# Patient Record
Sex: Male | Born: 1950
Health system: Southern US, Community
[De-identification: ages and names within clinical notes are randomized; demographics above are authoritative.]

## PROBLEM LIST (undated history)

## (undated) DIAGNOSIS — E785 Hyperlipidemia, unspecified: Secondary | ICD-10-CM

## (undated) DIAGNOSIS — L290 Pruritus ani: Secondary | ICD-10-CM

## (undated) DIAGNOSIS — J329 Chronic sinusitis, unspecified: Secondary | ICD-10-CM

## (undated) DIAGNOSIS — T7840XA Allergy, unspecified, initial encounter: Secondary | ICD-10-CM

## (undated) HISTORY — DX: Pruritus ani: L29.0

## (undated) HISTORY — DX: Chronic sinusitis, unspecified: J32.9

## (undated) HISTORY — DX: Hyperlipidemia, unspecified: E78.5

## (undated) HISTORY — DX: Allergy, unspecified, initial encounter: T78.40XA

---

## 2002-02-14 HISTORY — PX: KNEE ARTHROSCOPY: SUR90

## 2002-03-04 ENCOUNTER — Ambulatory Visit (HOSPITAL_COMMUNITY): Admission: RE | Admit: 2002-03-04 | Discharge: 2002-03-04 | Payer: Self-pay | Admitting: Orthopaedic Surgery

## 2002-03-04 ENCOUNTER — Encounter: Payer: Self-pay | Admitting: Orthopaedic Surgery

## 2004-02-15 HISTORY — PX: CARPAL TUNNEL RELEASE: SHX101

## 2008-08-12 ENCOUNTER — Encounter (INDEPENDENT_AMBULATORY_CARE_PROVIDER_SITE_OTHER): Payer: Self-pay | Admitting: *Deleted

## 2011-07-05 ENCOUNTER — Encounter: Payer: Self-pay | Admitting: Gastroenterology

## 2011-10-03 ENCOUNTER — Encounter: Payer: Self-pay | Admitting: Gastroenterology

## 2011-11-28 ENCOUNTER — Encounter: Payer: Self-pay | Admitting: Gastroenterology

## 2011-11-28 ENCOUNTER — Ambulatory Visit (AMBULATORY_SURGERY_CENTER): Payer: Managed Care, Other (non HMO) | Admitting: *Deleted

## 2011-11-28 VITALS — Ht 73.0 in | Wt 203.0 lb

## 2011-11-28 DIAGNOSIS — Z1211 Encounter for screening for malignant neoplasm of colon: Secondary | ICD-10-CM

## 2011-11-28 MED ORDER — MOVIPREP 100 G PO SOLR: ORAL | Status: DC

## 2011-12-09 ENCOUNTER — Ambulatory Visit (AMBULATORY_SURGERY_CENTER): Payer: Managed Care, Other (non HMO) | Admitting: Gastroenterology

## 2011-12-09 ENCOUNTER — Encounter: Payer: Self-pay | Admitting: Gastroenterology

## 2011-12-09 VITALS — BP 121/76 | HR 68 | Temp 96.6°F | Resp 17 | Ht 73.0 in | Wt 203.0 lb

## 2011-12-09 DIAGNOSIS — D126 Benign neoplasm of colon, unspecified: Secondary | ICD-10-CM

## 2011-12-09 DIAGNOSIS — Z1211 Encounter for screening for malignant neoplasm of colon: Secondary | ICD-10-CM

## 2011-12-09 MED ORDER — SODIUM CHLORIDE 0.9 % IV SOLN: 500.0000 mL | INTRAVENOUS | Status: DC

## 2011-12-09 NOTE — Op Note (Signed)
[facility] [address] [place], [address]   COLONOSCOPY PROCEDURE REPORT  PATIENT: Greg Thomas, Greg Thomas  MR#: [id number] BIRTHDATE: September 08, 1950 , 61  yrs. old GENDER: Male ENDOSCOPIST: Meryl Dare, MD, [facility] PROCEDURE DATE:  12/09/2011 PROCEDURE:   Colonoscopy with snare polypectomy ASA CLASS:   Class II INDICATIONS:average risk screening. MEDICATIONS: MAC sedation, administered by CRNA and propofol (Diprivan) 240mg  IV DESCRIPTION OF PROCEDURE:   After the risks benefits and alternatives of the procedure were thoroughly explained, informed consent was obtained.  A digital rectal exam revealed no abnormalities of the rectum.   The LB CF-[id number]  endoscope was introduced through the anus and advanced to the cecum, which was identified by both the appendix and ileocecal valve. No adverse events experienced.   The quality of the prep was good, using MoviPrep  The instrument was then slowly withdrawn as the colon was fully examined.  COLON FINDINGS: A sessile polyp measuring 8 mm in size was found at the cecum.  A polypectomy was performed using snare cautery.  The resection was complete and the polyp tissue was completely retrieved.   Mild diverticulosis was noted in the sigmoid colon. Retroflexed views revealed small internal hemorrhoids. The time to cecum=1 minutes 15 seconds.  Withdrawal time=10 minutes 45 seconds. The scope was withdrawn and the procedure completed.  COMPLICATIONS: There were no complications.  ENDOSCOPIC IMPRESSION: 1.  Sessile polyp measuring 8 mm at the cecum; polypectomy was performed using snare cautery 2.  Small internal hemorrhoids  RECOMMENDATIONS: 1.  Await pathology results 2.  Repeat colonoscopy in 5 years if polyp adenomatous; otherwise 10 years 3.  High fiber diet with liberal fluid intake.  eSigned:  Meryl Dare, MD, [facility] 12/09/2011 9:40 AM   cc: Rodrigo Ran, MD

## 2011-12-09 NOTE — Patient Instructions (Addendum)

## 2011-12-09 NOTE — Progress Notes (Signed)
Patient did not experience any of the following events: a burn prior to discharge; a fall within the facility; wrong site/side/patient/procedure/implant event; or a hospital transfer or hospital admission upon discharge from the facility. [phone]) Patient did not have preoperative order for IV antibiotic SSI prophylaxis.8)

## 2011-12-12 ENCOUNTER — Telehealth: Payer: Self-pay | Admitting: *Deleted

## 2011-12-12 NOTE — Telephone Encounter (Signed)
Left message on number given in admitting on Friday. ewm 

## 2011-12-13 ENCOUNTER — Encounter: Payer: Self-pay | Admitting: Gastroenterology

## 2014-10-23 ENCOUNTER — Encounter: Payer: Self-pay | Admitting: Gastroenterology

## 2016-08-05 DIAGNOSIS — J301 Allergic rhinitis due to pollen: Secondary | ICD-10-CM | POA: Diagnosis not present

## 2016-08-05 DIAGNOSIS — J342 Deviated nasal septum: Secondary | ICD-10-CM | POA: Diagnosis not present

## 2016-08-05 DIAGNOSIS — J329 Chronic sinusitis, unspecified: Secondary | ICD-10-CM | POA: Diagnosis not present

## 2016-08-22 DIAGNOSIS — J329 Chronic sinusitis, unspecified: Secondary | ICD-10-CM | POA: Diagnosis not present

## 2016-08-29 DIAGNOSIS — J301 Allergic rhinitis due to pollen: Secondary | ICD-10-CM | POA: Diagnosis not present

## 2016-08-29 DIAGNOSIS — J329 Chronic sinusitis, unspecified: Secondary | ICD-10-CM | POA: Diagnosis not present

## 2016-08-29 DIAGNOSIS — J342 Deviated nasal septum: Secondary | ICD-10-CM | POA: Diagnosis not present

## 2016-10-12 DIAGNOSIS — E784 Other hyperlipidemia: Secondary | ICD-10-CM | POA: Diagnosis not present

## 2016-10-12 DIAGNOSIS — Z125 Encounter for screening for malignant neoplasm of prostate: Secondary | ICD-10-CM | POA: Diagnosis not present

## 2016-10-12 DIAGNOSIS — R7301 Impaired fasting glucose: Secondary | ICD-10-CM | POA: Diagnosis not present

## 2016-10-12 DIAGNOSIS — R946 Abnormal results of thyroid function studies: Secondary | ICD-10-CM | POA: Diagnosis not present

## 2016-10-12 DIAGNOSIS — M109 Gout, unspecified: Secondary | ICD-10-CM | POA: Diagnosis not present

## 2016-10-26 DIAGNOSIS — Z6827 Body mass index (BMI) 27.0-27.9, adult: Secondary | ICD-10-CM | POA: Diagnosis not present

## 2016-10-26 DIAGNOSIS — Z23 Encounter for immunization: Secondary | ICD-10-CM | POA: Diagnosis not present

## 2016-10-26 DIAGNOSIS — R0683 Snoring: Secondary | ICD-10-CM | POA: Diagnosis not present

## 2016-10-26 DIAGNOSIS — J3089 Other allergic rhinitis: Secondary | ICD-10-CM | POA: Diagnosis not present

## 2016-10-26 DIAGNOSIS — M109 Gout, unspecified: Secondary | ICD-10-CM | POA: Diagnosis not present

## 2016-10-26 DIAGNOSIS — R7301 Impaired fasting glucose: Secondary | ICD-10-CM | POA: Diagnosis not present

## 2016-10-26 DIAGNOSIS — E784 Other hyperlipidemia: Secondary | ICD-10-CM | POA: Diagnosis not present

## 2016-10-26 DIAGNOSIS — Z0000 Encounter for general adult medical examination without abnormal findings: Secondary | ICD-10-CM | POA: Diagnosis not present

## 2016-10-26 DIAGNOSIS — L308 Other specified dermatitis: Secondary | ICD-10-CM | POA: Diagnosis not present

## 2016-10-26 DIAGNOSIS — F5221 Male erectile disorder: Secondary | ICD-10-CM | POA: Diagnosis not present

## 2016-10-26 DIAGNOSIS — Z1389 Encounter for screening for other disorder: Secondary | ICD-10-CM | POA: Diagnosis not present

## 2016-10-26 DIAGNOSIS — K4090 Unilateral inguinal hernia, without obstruction or gangrene, not specified as recurrent: Secondary | ICD-10-CM | POA: Diagnosis not present

## 2016-10-28 DIAGNOSIS — Z1212 Encounter for screening for malignant neoplasm of rectum: Secondary | ICD-10-CM | POA: Diagnosis not present

## 2016-11-29 DIAGNOSIS — J342 Deviated nasal septum: Secondary | ICD-10-CM | POA: Diagnosis not present

## 2016-11-29 DIAGNOSIS — J329 Chronic sinusitis, unspecified: Secondary | ICD-10-CM | POA: Diagnosis not present

## 2016-12-06 ENCOUNTER — Encounter: Payer: Self-pay | Admitting: *Deleted

## 2016-12-09 NOTE — Discharge Instructions (Signed)
[facility] °[facility] °ENDOSCOPIC SINUS SURGERY °[facility] ° °What is Functional Endoscopic Sinus Surgery? ° The Surgery involves making the natural openings of the sinuses larger by removing the bony partitions that separate the sinuses from the nasal cavity.  The natural sinus lining is preserved as much as possible to allow the sinuses to resume normal function after the surgery.  In some patients nasal polyps (excessively swollen lining of the sinuses) may be removed to relieve obstruction of the sinus openings.  The surgery is performed through the nose using lighted scopes, which eliminates the need for incisions on the face.  A septoplasty is a different procedure which is sometimes performed with sinus surgery.  It involves straightening the boy partition that separates the two sides of your nose.  A crooked or deviated septum may need repair if is obstructing the sinuses or nasal airflow.  Turbinate reduction is also often performed during sinus surgery.  The turbinates are bony proturberances from the side walls of the nose which swell and can obstruct the nose in patients with sinus and allergy problems.  Their size can be surgically reduced to help relieve nasal obstruction. ° °What Can Sinus Surgery Do For Me? ° Sinus surgery can reduce the frequency of sinus infections requiring antibiotic treatment.  This can provide improvement in nasal congestion, post-nasal drainage, facial pressure and nasal obstruction.  Surgery will NOT prevent you from ever having an infection again, so it usually only for patients who get infections 4 or more times yearly requiring antibiotics, or for infections that do not clear with antibiotics.  It will not cure nasal allergies, so patients with allergies may still require medication to treat their allergies after surgery. Surgery may improve headaches related to sinusitis, however, some people will continue to  require medication to control sinus headaches related to allergies.  Surgery will do nothing for other forms of headache (migraine, tension or cluster). ° °What Are the Risks of Endoscopic Sinus Surgery? ° Current techniques allow surgery to be performed safely with little risk, however, there are rare complications that patients should be aware of.  Because the sinuses are located around the eyes, there is risk of eye injury, including blindness, though again, this would be quite rare. This is usually a result of bleeding behind the eye during surgery, which puts the vision oat risk, though there are treatments to protect the vision and prevent permanent disrupted by surgery causing a leak of the spinal fluid that surrounds the brain.  More serious complications would include bleeding inside the brain cavity or damage to the brain.  Again, all of these complications are uncommon, and spinal fluid leaks can be safely managed surgically if they occur.  The most common complication of sinus surgery is bleeding from the nose, which may require packing or cauterization of the nose.  Continued sinus have polyps may experience recurrence of the polyps requiring revision surgery.  Alterations of sense of smell or injury to the tear ducts are also rare complications.  ° °What is the Surgery Like, and what is the Recovery? ° The Surgery usually takes a couple of hours to perform, and is usually performed under a general anesthetic (completely asleep).  Patients are usually discharged home after a couple of hours.  Sometimes during surgery it is necessary to pack the nose to control bleeding, and the packing is left in place for 24 - 48 hours, and removed by your surgeon.    If a septoplasty was performed during the procedure, there is often a splint placed which must be removed after 5-7 days.   °Discomfort: Pain is usually mild to moderate, and can be controlled by prescription pain medication or acetaminophen (Tylenol).   Aspirin, Ibuprofen (Advil, Motrin), or Naprosyn (Aleve) should be avoided, as they can cause increased bleeding.  Most patients feel sinus pressure like they have a bad head cold for several days.  Sleeping with your head elevated can help reduce swelling and facial pressure, as can ice packs over the face.  A humidifier may be helpful to keep the mucous and blood from drying in the nose.  ° °Diet: There are no specific diet restrictions, however, you should generally start with clear liquids and a light diet of bland foods because the anesthetic can cause some nausea.  Advance your diet depending on how your stomach feels.  Taking your pain medication with food will often help reduce stomach upset which pain medications can cause. ° °Nasal Saline Irrigation: It is important to remove blood clots and dried mucous from the nose as it is healing.  This is done by having you irrigate the nose at least 3 - 4 times daily with a salt water solution.  We recommend using NeilMed Sinus Rinse (available at the drug store).  Fill the squeeze bottle with the solution, bend over a sink, and insert the tip of the squeeze bottle into the nose ½ of an inch.  Point the tip of the squeeze bottle towards the inside corner of the eye on the same side your irrigating.  Squeeze the bottle and gently irrigate the nose.  If you bend forward as you do this, most of the fluid will flow back out of the nose, instead of down your throat.   The solution should be warm, near body temperature, when you irrigate.   Each time you irrigate, you should use a full squeeze bottle.  ° °Note that if you are instructed to use Nasal Steroid Sprays at any time after your surgery, irrigate with saline BEFORE using the steroid spray, so you do not wash it all out of the nose. °Another product, Nasal Saline Gel (such as AYR Nasal Saline Gel) can be applied in each nostril 3 - 4 times daily to moisture the nose and reduce scabbing or crusting. ° °Bleeding:   Bloody drainage from the nose can be expected for several days, and patients are instructed to irrigate their nose frequently with salt water to help remove mucous and blood clots.  The drainage may be dark red or brown, though some fresh blood may be seen intermittently, especially after irrigation.  Do not blow you nose, as bleeding may occur. If you must sneeze, keep your mouth open to allow air to escape through your mouth. ° °If heavy bleeding occurs: Irrigate the nose with saline to rinse out clots, then spray the nose 3 - 4 times with Afrin Nasal Decongestant Spray.  The spray will constrict the blood vessels to slow bleeding.  Pinch the lower half of your nose shut to apply pressure, and lay down with your head elevated.  Ice packs over the nose may help as well. If bleeding persists despite these measures, you should notify your doctor.  Do not use the Afrin routinely to control nasal congestion after surgery, as it can result in worsening congestion and may affect healing.  ° ° ° °Activity: Return to work varies among patients. Most patients will be   out of work at least 5 - 7 days to recover.  Patient may return to work after they are off of narcotic pain medication, and feeling well enough to perform the functions of their job.  Patients must avoid heavy lifting (over 10 pounds) or strenuous physical for 2 weeks after surgery, so your employer may need to assign you to light duty, or keep you out of work longer if light duty is not possible.  NOTE: you should not drive, operate dangerous machinery, do any mentally demanding tasks or make any important legal or financial decisions while on narcotic pain medication and recovering from the general anesthetic.  °  °Call Your Doctor Immediately if You Have Any of the Following: °1. Bleeding that you cannot control with the above measures °2. Loss of vision, double vision, bulging of the eye or black eyes. °3. Fever over 101 degrees °4. Neck stiffness with  severe headache, fever, nausea and change in mental state. °You are always encourage to call anytime with concerns, however, please call with requests for pain medication refills during office hours. ° °Office Endoscopy: During follow-up visits your doctor will remove any packing or splints that may have been placed and evaluate and clean your sinuses endoscopically.  Topical anesthetic will be used to make this as comfortable as possible, though you may want to take your pain medication prior to the visit.  How often this will need to be done varies from patient to patient.  After complete recovery from the surgery, you may need follow-up endoscopy from time to time, particularly if there is concern of recurrent infection or nasal polyps. ° ° °General Anesthesia, Adult, Care After °These instructions provide you with information about caring for yourself after your procedure. Your health care provider may also give you more specific instructions. Your treatment has been planned according to current medical practices, but problems sometimes occur. Call your health care provider if you have any problems or questions after your procedure. °What can I expect after the procedure? °After the procedure, it is common to have: °· Vomiting. °· A sore throat. °· Mental slowness. ° °It is common to feel: °· Nauseous. °· Cold or shivery. °· Sleepy. °· Tired. °· Sore or achy, even in parts of your body where you did not have surgery. ° °Follow these instructions at home: °For at least 24 hours after the procedure: °· Do not: °? Participate in activities where you could fall or become injured. °? Drive. °? Use heavy machinery. °? Drink alcohol. °? Take sleeping pills or medicines that cause drowsiness. °? Make important decisions or sign legal documents. °? Take care of children on your own. °· Rest. °Eating and drinking °· If you vomit, drink water, juice, or soup when you can drink without vomiting. °· Drink enough fluid to  keep your urine clear or pale yellow. °· Make sure you have little or no nausea before eating solid foods. °· Follow the diet recommended by your health care provider. °General instructions °· Have a responsible adult stay with you until you are awake and alert. °· Return to your normal activities as told by your health care provider. Ask your health care provider what activities are safe for you. °· Take over-the-counter and prescription medicines only as told by your health care provider. °· If you smoke, do not smoke without supervision. °· Keep all follow-up visits as told by your health care provider. This is important. °Contact a health care provider if: °· You   continue to have nausea or vomiting at home, and medicines are not helpful. °· You cannot drink fluids or start eating again. °· You cannot urinate after 8-12 hours. °· You develop a skin rash. °· You have fever. °· You have increasing redness at the site of your procedure. °Get help right away if: °· You have difficulty breathing. °· You have chest pain. °· You have unexpected bleeding. °· You feel that you are having a life-threatening or urgent problem. °This information is not intended to replace advice given to you by your health care provider. Make sure you discuss any questions you have with your health care provider. °Document Released: 05/09/2000 Document Revised: 07/06/2015 Document Reviewed: 01/15/2015 °Elsevier Interactive Patient Education © 2018 [employer] ° °

## 2016-12-12 ENCOUNTER — Encounter: Payer: Self-pay | Admitting: Gastroenterology

## 2016-12-13 ENCOUNTER — Encounter: Payer: Self-pay | Admitting: Gastroenterology

## 2016-12-15 ENCOUNTER — Ambulatory Visit
Admission: RE | Admit: 2016-12-15 | Discharge: 2016-12-15 | Disposition: A | Discharge status: Home / Self Care | Payer: Medicare Other | Source: Ambulatory Visit | Attending: Otolaryngology | Admitting: Otolaryngology

## 2016-12-15 ENCOUNTER — Ambulatory Visit: Payer: Medicare Other | Admitting: Anesthesiology

## 2016-12-15 ENCOUNTER — Encounter
Admission: RE | Disposition: A | Discharge status: Home / Self Care | Payer: Self-pay | Source: Ambulatory Visit | Attending: Otolaryngology

## 2016-12-15 DIAGNOSIS — J329 Chronic sinusitis, unspecified: Secondary | ICD-10-CM | POA: Insufficient documentation

## 2016-12-15 DIAGNOSIS — Z79899 Other long term (current) drug therapy: Secondary | ICD-10-CM | POA: Diagnosis not present

## 2016-12-15 DIAGNOSIS — Z87891 Personal history of nicotine dependence: Secondary | ICD-10-CM | POA: Diagnosis not present

## 2016-12-15 DIAGNOSIS — Z808 Family history of malignant neoplasm of other organs or systems: Secondary | ICD-10-CM | POA: Insufficient documentation

## 2016-12-15 DIAGNOSIS — J988 Other specified respiratory disorders: Secondary | ICD-10-CM | POA: Diagnosis not present

## 2016-12-15 DIAGNOSIS — J342 Deviated nasal septum: Secondary | ICD-10-CM | POA: Diagnosis not present

## 2016-12-15 DIAGNOSIS — J321 Chronic frontal sinusitis: Secondary | ICD-10-CM | POA: Diagnosis not present

## 2016-12-15 DIAGNOSIS — J320 Chronic maxillary sinusitis: Secondary | ICD-10-CM | POA: Diagnosis not present

## 2016-12-15 DIAGNOSIS — J322 Chronic ethmoidal sinusitis: Secondary | ICD-10-CM | POA: Diagnosis not present

## 2016-12-15 DIAGNOSIS — J323 Chronic sphenoidal sinusitis: Secondary | ICD-10-CM | POA: Diagnosis not present

## 2016-12-15 DIAGNOSIS — M109 Gout, unspecified: Secondary | ICD-10-CM | POA: Diagnosis not present

## 2016-12-15 DIAGNOSIS — J343 Hypertrophy of nasal turbinates: Secondary | ICD-10-CM | POA: Diagnosis not present

## 2016-12-15 HISTORY — PX: MAXILLARY ANTROSTOMY: SHX2003

## 2016-12-15 HISTORY — PX: SPHENOIDECTOMY: SHX2421

## 2016-12-15 HISTORY — PX: ETHMOIDECTOMY: SHX5197

## 2016-12-15 HISTORY — PX: IMAGE GUIDED SINUS SURGERY: SHX6570

## 2016-12-15 HISTORY — PX: TURBINATE REDUCTION: SHX6157

## 2016-12-15 HISTORY — PX: SEPTOPLASTY: SHX2393

## 2016-12-15 SURGERY — SINUS SURGERY, WITH IMAGING GUIDANCE
Anesthesia: General | Wound class: Clean Contaminated

## 2016-12-15 MED ORDER — OXYCODONE HCL 5 MG PO TABS
5.0000 mg | ORAL_TABLET | Freq: Once | ORAL | Status: AC | PRN
  Administered 2016-12-15: 5 mg via ORAL

## 2016-12-15 MED ORDER — PROMETHAZINE HCL 25 MG/ML IJ SOLN: 6.2500 mg | INTRAMUSCULAR | Status: DC | PRN

## 2016-12-15 MED ORDER — PHENYLEPHRINE HCL 0.5 % NA SOLN
NASAL | Status: DC | PRN
  Administered 2016-12-15: 15 mL via TOPICAL

## 2016-12-15 MED ORDER — SUCCINYLCHOLINE CHLORIDE 20 MG/ML IJ SOLN
INTRAMUSCULAR | Status: DC | PRN
  Administered 2016-12-15: 100 mg via INTRAVENOUS

## 2016-12-15 MED ORDER — ONDANSETRON HCL 4 MG/2ML IJ SOLN
INTRAMUSCULAR | Status: DC | PRN
  Administered 2016-12-15: 4 mg via INTRAVENOUS

## 2016-12-15 MED ORDER — ROCURONIUM BROMIDE 100 MG/10ML IV SOLN
INTRAVENOUS | Status: DC | PRN
  Administered 2016-12-15: 5 mg via INTRAVENOUS
  Administered 2016-12-15: 25 mg via INTRAVENOUS
  Administered 2016-12-15: 10 mg via INTRAVENOUS

## 2016-12-15 MED ORDER — OXYMETAZOLINE HCL 0.05 % NA SOLN
2.0000 | Freq: Once | NASAL | Status: AC
  Administered 2016-12-15: 2 via NASAL

## 2016-12-15 MED ORDER — ACETAMINOPHEN 160 MG/5ML PO SOLN: 325.0000 mg | ORAL | Status: DC | PRN

## 2016-12-15 MED ORDER — FENTANYL CITRATE (PF) 100 MCG/2ML IJ SOLN
INTRAMUSCULAR | Status: DC | PRN
  Administered 2016-12-15: 50 ug via INTRAVENOUS
  Administered 2016-12-15: 100 ug via INTRAVENOUS

## 2016-12-15 MED ORDER — DEXTROSE 5 % IV SOLN
2000.0000 mg | Freq: Once | INTRAVENOUS | Status: AC
  Administered 2016-12-15: 2000 mg via INTRAVENOUS

## 2016-12-15 MED ORDER — PROPOFOL 10 MG/ML IV BOLUS
INTRAVENOUS | Status: DC | PRN
  Administered 2016-12-15: 150 mg via INTRAVENOUS

## 2016-12-15 MED ORDER — DEXAMETHASONE SODIUM PHOSPHATE 4 MG/ML IJ SOLN
INTRAMUSCULAR | Status: DC | PRN
  Administered 2016-12-15: 10 mg via INTRAVENOUS

## 2016-12-15 MED ORDER — LIDOCAINE-EPINEPHRINE 1 %-1:100000 IJ SOLN
INTRAMUSCULAR | Status: DC | PRN
  Administered 2016-12-15: 7 mL

## 2016-12-15 MED ORDER — MIDAZOLAM HCL 5 MG/5ML IJ SOLN
INTRAMUSCULAR | Status: DC | PRN
  Administered 2016-12-15: 2 mg via INTRAVENOUS

## 2016-12-15 MED ORDER — ACETAMINOPHEN 10 MG/ML IV SOLN
1000.0000 mg | Freq: Once | INTRAVENOUS | Status: AC
  Administered 2016-12-15: 1000 mg via INTRAVENOUS

## 2016-12-15 MED ORDER — FENTANYL CITRATE (PF) 100 MCG/2ML IJ SOLN: 25.0000 ug | INTRAMUSCULAR | Status: DC | PRN

## 2016-12-15 MED ORDER — LIDOCAINE HCL (CARDIAC) 20 MG/ML IV SOLN
INTRAVENOUS | Status: DC | PRN
  Administered 2016-12-15: 50 mg via INTRAVENOUS

## 2016-12-15 MED ORDER — OXYCODONE HCL 5 MG/5ML PO SOLN: 5.0000 mg | Freq: Once | ORAL | Status: AC | PRN

## 2016-12-15 MED ORDER — ACETAMINOPHEN 325 MG PO TABS: 325.0000 mg | ORAL_TABLET | ORAL | Status: DC | PRN

## 2016-12-15 MED ORDER — LACTATED RINGERS IV SOLN
10.0000 mL/h | INTRAVENOUS | Status: DC
  Administered 2016-12-15: 10 mL/h via INTRAVENOUS

## 2016-12-15 SURGICAL SUPPLY — 36 items
BATTERY INSTRU NAVIGATION (MISCELLANEOUS) ×14 IMPLANT
BTRY SRG DRVR LF (MISCELLANEOUS) ×6
CANISTER SUCT 1200ML W/VALVE (MISCELLANEOUS) ×4 IMPLANT
CATH IV 18X1 1/4 SAFELET (CATHETERS) ×4 IMPLANT
COAGULATOR SUCT 8FR VV (MISCELLANEOUS) ×4 IMPLANT
DRAPE HEAD BAR (DRAPES) ×4 IMPLANT
GLOVE PI ULTRA LF STRL 7.5 (GLOVE) ×4 IMPLANT
GLOVE PI ULTRA NON LATEX 7.5 (GLOVE) ×4
IV CATH 18X1 1/4 SAFELET (CATHETERS) ×2
IV NS 500ML (IV SOLUTION) ×4
IV NS 500ML BAXH (IV SOLUTION) ×2 IMPLANT
KIT ROOM TURNOVER OR (KITS) ×4 IMPLANT
NDL ANESTHESIA 27G X 3.5 (NEEDLE) ×2 IMPLANT
NDL SPNL 25GX3.5 QUINCKE BL (NEEDLE) ×2 IMPLANT
NEEDLE ANESTHESIA  27G X 3.5 (NEEDLE) ×2
NEEDLE ANESTHESIA 27G X 3.5 (NEEDLE) ×2 IMPLANT
NEEDLE SPNL 25GX3.5 QUINCKE BL (NEEDLE) ×4 IMPLANT
NS IRRIG 500ML POUR BTL (IV SOLUTION) ×4 IMPLANT
PACK DRAPE NASAL/ENT (PACKS) ×4 IMPLANT
PACKING NASAL EPIS 4X2.4 XEROG (MISCELLANEOUS) ×10 IMPLANT
PAD GROUND ADULT SPLIT (MISCELLANEOUS) ×4 IMPLANT
PATTIES SURGICAL .5 X3 (DISPOSABLE) ×4 IMPLANT
SHAVER DIEGO BLD STD TYPE A (BLADE) ×2 IMPLANT
SOL ANTI-FOG 6CC FOG-OUT (MISCELLANEOUS) ×2 IMPLANT
SOL FOG-OUT ANTI-FOG 6CC (MISCELLANEOUS) ×2
SPLINT NASAL SEPTAL BLV .50 ST (MISCELLANEOUS) ×4 IMPLANT
STRAP BODY AND KNEE 60X3 (MISCELLANEOUS) ×6 IMPLANT
SUT CHROMIC 3-0 (SUTURE) ×4
SUT CHROMIC 3-0 KS 27XMFL CR (SUTURE) ×2
SUT ETHILON 3-0 KS 30 BLK (SUTURE) ×4 IMPLANT
SUT PLAIN GUT 4-0 (SUTURE) ×4 IMPLANT
SUTURE CHRMC 3-0 KS 27XMFL CR (SUTURE) IMPLANT
SYR 3ML LL SCALE MARK (SYRINGE) ×4 IMPLANT
TRACKER CRANIALMASK (MASK) ×4 IMPLANT
TUBING DECLOG MULTIDEBRIDER (TUBING) ×2 IMPLANT
WATER STERILE IRR 250ML POUR (IV SOLUTION) ×4 IMPLANT

## 2016-12-15 NOTE — Anesthesia Postprocedure Evaluation (Signed)
Anesthesia Post Note  Patient: GREYSYN VANDERBERG  Procedure(s) Performed: IMAGE GUIDED SINUS SURGERY  need disk (Bilateral ) SEPTOPLASTY (N/A ) MAXILLARY ANTROSTOMY WITH REMOVAL OF TISSUE FROM SINUS (Bilateral ) ETHMOIDECTOMY WITH FRONTAL SINUS EXPLORATION (Bilateral ) SPHENOIDECTOMY (Bilateral ) INFERIOR TURBINATE REDUCTION (Bilateral )  Patient location during evaluation: PACU Anesthesia Type: General Level of consciousness: awake and alert Pain management: pain level controlled Vital Signs Assessment: post-procedure vital signs reviewed and stable Respiratory status: spontaneous breathing, nonlabored ventilation, respiratory function stable and patient connected to nasal cannula oxygen Cardiovascular status: blood pressure returned to baseline and stable Postop Assessment: no apparent nausea or vomiting Anesthetic complications: no    Akansha Wyche

## 2016-12-15 NOTE — H&P (Signed)
H&P has been reviewed and pt reevaluated, and no changes necessary. To be downloaded later.  

## 2016-12-15 NOTE — Anesthesia Procedure Notes (Signed)
Procedure Name: Intubation Date/Time: 12/15/2016 10:51 AM Performed by: Londell Moh Pre-anesthesia Checklist: Patient identified, Emergency Drugs available, Suction available, Patient being monitored and Timeout performed Patient Re-evaluated:Patient Re-evaluated prior to induction Oxygen Delivery Method: Circle system utilized Preoxygenation: Pre-oxygenation with 100% oxygen Induction Type: IV induction Ventilation: Mask ventilation without difficulty Laryngoscope Size: Mac and 3 Grade View: Grade II Tube type: Oral Rae Tube size: 7.5 mm Number of attempts: 1 Placement Confirmation: ETT inserted through vocal cords under direct vision,  positive ETCO2 and breath sounds checked- equal and bilateral Tube secured with: Tape Dental Injury: Teeth and Oropharynx as per pre-operative assessment

## 2016-12-15 NOTE — Anesthesia Preprocedure Evaluation (Signed)
Anesthesia Evaluation  Patient identified by MRN, date of birth, ID band  Reviewed: NPO status   History of Anesthesia Complications Negative for: history of anesthetic complications  Airway Mallampati: II  TM Distance: >3 FB Neck ROM: full    Dental no notable dental hx.    Pulmonary neg pulmonary ROS, former smoker,    Pulmonary exam normal        Cardiovascular Exercise Tolerance: Good Normal cardiovascular exam  lipids   Neuro/Psych negative neurological ROS  negative psych ROS   GI/Hepatic negative GI ROS, Neg liver ROS,   Endo/Other  negative endocrine ROS  Renal/GU negative Renal ROS  negative genitourinary   Musculoskeletal gout   Abdominal   Peds  Hematology negative hematology ROS (+)   Anesthesia Other Findings   Reproductive/Obstetrics                             Anesthesia Physical Anesthesia Plan  ASA: II  Anesthesia Plan: General ETT   Post-op Pain Management:    Induction:   PONV Risk Score and Plan:   Airway Management Planned:   Additional Equipment:   Intra-op Plan:   Post-operative Plan:   Informed Consent: I have reviewed the patients History and Physical, chart, labs and discussed the procedure including the risks, benefits and alternatives for the proposed anesthesia with the patient or authorized representative who has indicated his/her understanding and acceptance.     Plan Discussed with: CRNA  Anesthesia Plan Comments:         Anesthesia Quick Evaluation

## 2016-12-15 NOTE — Op Note (Signed)
12/15/2016  12:54 PM    Greg Thomas  [id number]   Pre-Op Dx:  Chronic bilateral ethmoid sinusitis, sphenoid sinusitis, frontal sinusitis, and maxillary sinusitis. Deviated septum with airway obstruction  Post-op Dx: Chronic bilateral ethmoid sinusitis, sphenoid sinusitis, frontal sinusitis, maxillary sinusitis. Deviated septum with airway obstruction  Proc: Bilateral endoscopic total ethmoidectomy with sphenoid sinusotomy, bilateral endoscopic frontal sinusotomy, bilateral endoscopic maxillary antrostomy, septoplasty, outfracture inferior turbinates, use of image guided system  Surg:  Greg Thomas  Anes:  GOT  EBL:  150 mL  Comp:  None  Findings:  Septum deviated to the left side causing airway obstruction left. Chronic inflamed mucous membranes throughout all the sinuses with areas of mucus retention. No evidence of mucopurulence.  Procedure: The patient was brought to the operating room placed in supine position. General anesthesia was given by oral endotracheal intubation. His nose was prepped using 7 mL of 1% Xylocaine with epi 1:100,000 for infiltration the nasal septum and lateral nasal walls. Cottonoid pledgets soaked with phenylephrine and Xylocaine were placed in the nose on both sides. The image guided system was brought in and the CT scan was downloaded from the disc. The template was applied the face and this was registered to the system. There is 0.9 mm of variance. The suction instruments were then registered to the system and used to check the anatomy. There appeared to be good alignment between the suction instruments and the CT scan. The patient was prepped and draped sterile fashion.  The 0 scope was used to visualize both sides the nose. Cotton pledgets were removed. Septum was markedly deviated left side since I couldn't get the scope through the left side superiorly. The septoplasty was done first. A left [place]ian incision was created with elevation of the  mucoperichondrium on the left side of the quadrangular plate. There was overhanging cartilage inferiorly on to the left side of the maxillary crest. The bony cartilaginous junction was split and the mucoperiosteum was elevated over both sides the ethmoid plate and vomer. This was markedly pulled to the left side. The what plate and vomer were fractured and freed up and part of this was removed. The inferior border of the quadrangular plate was incised and the inferior overhanging cartilage was removed. This allowed the quadrangular plate swing back towards the midline. The posterior deviation had been previously removed when the vomer and ethmoid plate's were trimmed.. The mucosal flaps were placed back in their anatomic position and this showed a nice straight airway on both sides. 3-0 chromic was used to create a through and through stitch and anchor the inferior quadrangular plate at the inferior nasal spine in the midline. A 40 plain gut suture on a small Keith needle was used for sewing the flaps together in a through and through whip stitch fashion. This was used for closure of the [place] incision as well.  The 0 scope was now used again to visualize the airways. The left side and airway was much more open. The right side area was still open although the inferior turbinates were outfractured to create more room getting through the nasal passages. The left side of the patient was addressed first.  With the 0 scope the middle turbinate was infractured to visualize the middle meatus better. A glide biter was used for incising the uncinate process and then the uncinate was removed with 45 through biting forceps. The entire uncinate was removed all the way to its superior border. Maxillary antrum was visible and using the  Diego microdebrider was widened. There was some thickened mucous membranes at the superior anterior border of this and those were removed. The 30 scope was used for visualizing the  opening better and making sure the antrum was completely opened from the natural ostium downward and posteriorly. The bottom the sinus appeared to be relatively clear at this time. The 0 scope was used again for opening up the posterior middle ethmoid air cells. This was done with ethmoid forceps and Diego microdebrider. The image guided system was used to evaluate the depth of dissection to make sure that all of the posterior and middle ethmoid air cells were opened to the fovea ethmoidalis. The sphenoid sinus was then opened and widened as natural ostium to remove part of the sphenoid posterior ethmoid wall to create a large drainage port for the sphenoid sinus. The 30 scope was then used for visualizing the middle and anterior ethmoid air cells. Dissection was carried forward along the fovea ethmoidalis to open the middle and anterior ethmoid air cells. The frontal sinus image guided suction was then used for evaluating the frontal sinus area. The natural ostium was found medial on this side. There is a very large auger nasi cell that went upwards and the medial and superior wall of this were then broken free to open up the frontal sinus duct widely. This was done with curettes and boss frontal sinus through biting instruments. With the frontal sinus wide open and the ethmoids open, now all the sinuses were opened on the left side and cleaned out. A cottonoid pledget was placed on this side for vasoconstriction while the right side was addressed.  A 0 scope was used again for visualizing the airway and the middle turbinate was infractured. The side biter was used to incise the uncinate process and then this was completely removed. The 30 scope was used to visualize the maxillary antrum and this was widened inferiorly and posteriorly. The natural ostium was included and the little bit of inflammation was removed from the superior medial wall. The inferior portion of the maxillary sinus appeared to be more  clear. The 0 scope was used for opening the middle and posterior ethmoid air cells. The image guided system was used to make sure all of the posterior ethmoid air cells were opened up. The sphenoid sinus opening was widened into the posterior ethmoid air cells make sure there is good opening into the sphenoid sinus. The 30 scope was then used to open up the middle and anterior ethmoid air cells. The image guided system was used to make sure all the air cells were opened. There is inflammation more laterally had to be separated from the lamina papyracea. The frontal sinus image guided suction was used for opening up the frontal sinus duct. Again this was medial and had to be widened but left a good opening into the duct. This was done with the frontal sinus through biting instruments using the 30 scope. A cottonoid pledget was then placed on the right side as all the sinuses were opened.  The cottonoid pledget was removed from the left side and the sinuses were all revisualized. There was minimal bleeding and no evidence of disease and in the sinuses any further. A couple little bone chips were found that were removed and the sinuses all clear. Xerogel was then placed into the anterior ethmoid at the frontal sinus opening and further xerogel was placed it the sphenoid sinus opening and posterior ethmoid. A little more was  then placed over the maxillary antral opening. This was all wetted and liquefied. The right side was revisualized and again there is no further bleeding or signs of sinus disease. Xerogel was placed anteriorly posteriorly and then medially again as the opposite side. All of this was wetted and liquefied. Xomed 0.5 mm regular sized splints were then trimmed and placed on both sides the nasal septum. These were held in position with of 3-0 nylon through and through suture. The patient tolerated the procedure well area do was awakened and taken to the recovery room in satisfactory condition. There  were no operative complications.  Dispo:   To PACU to be discharged home  Plan:  To follow-up in the office in 6 days for reevaluation. We'll removed splints that time. He'll start saline flushes tomorrow. He'll restart his antibiotics and use a small prednisone taper. He has Norco pain medication to use for pain if needed.  Greg Thomas  12/15/2016 12:54 PM

## 2016-12-15 NOTE — Transfer of Care (Signed)
Immediate Anesthesia Transfer of Care Note  Patient: Greg Thomas  Procedure(s) Performed: IMAGE GUIDED SINUS SURGERY  need disk (Bilateral ) SEPTOPLASTY (N/A ) MAXILLARY ANTROSTOMY WITH REMOVAL OF TISSUE FROM SINUS (Bilateral ) ETHMOIDECTOMY WITH FRONTAL SINUS EXPLORATION (Bilateral ) SPHENOIDECTOMY (Bilateral ) INFERIOR TURBINATE REDUCTION (Bilateral )  Patient Location: PACU  Anesthesia Type: General ETT  Level of Consciousness: awake, alert  and patient cooperative  Airway and Oxygen Therapy: Patient Spontanous Breathing and Patient connected to supplemental oxygen  Post-op Assessment: Post-op Vital signs reviewed, Patient's Cardiovascular Status Stable, Respiratory Function Stable, Patent Airway and No signs of Nausea or vomiting  Post-op Vital Signs: Reviewed and stable  Complications: No apparent anesthesia complications

## 2016-12-16 ENCOUNTER — Encounter: Payer: Self-pay | Admitting: Otolaryngology

## 2016-12-19 LAB — SURGICAL PATHOLOGY

## 2016-12-20 DIAGNOSIS — Z48813 Encounter for surgical aftercare following surgery on the respiratory system: Secondary | ICD-10-CM | POA: Diagnosis not present

## 2016-12-27 DIAGNOSIS — Z48813 Encounter for surgical aftercare following surgery on the respiratory system: Secondary | ICD-10-CM | POA: Diagnosis not present

## 2017-01-10 DIAGNOSIS — Z48813 Encounter for surgical aftercare following surgery on the respiratory system: Secondary | ICD-10-CM | POA: Diagnosis not present

## 2017-01-27 DIAGNOSIS — J301 Allergic rhinitis due to pollen: Secondary | ICD-10-CM | POA: Diagnosis not present

## 2017-02-02 ENCOUNTER — Ambulatory Visit (AMBULATORY_SURGERY_CENTER): Payer: Self-pay

## 2017-02-02 VITALS — Ht 73.0 in | Wt 211.6 lb

## 2017-02-02 DIAGNOSIS — Z86010 Personal history of colonic polyps: Secondary | ICD-10-CM

## 2017-02-02 MED ORDER — NA SULFATE-K SULFATE-MG SULF 17.5-3.13-1.6 GM/177ML PO SOLN: 1.0000 | Freq: Once | ORAL | 0 refills | Status: AC

## 2017-02-02 NOTE — Progress Notes (Signed)
Per pt, no allergies to soy or egg products.Pt not taking any weight loss meds or using  O2 at home.  Pt refused emmi video. 

## 2017-02-16 ENCOUNTER — Other Ambulatory Visit: Payer: Self-pay

## 2017-02-16 ENCOUNTER — Encounter: Payer: Self-pay | Admitting: Gastroenterology

## 2017-02-16 ENCOUNTER — Ambulatory Visit (AMBULATORY_SURGERY_CENTER): Payer: Medicare Other | Admitting: Gastroenterology

## 2017-02-16 VITALS — BP 116/74 | HR 74 | Temp 96.0°F | Resp 16 | Ht 73.0 in | Wt 211.0 lb

## 2017-02-16 DIAGNOSIS — Z86010 Personal history of colonic polyps: Secondary | ICD-10-CM

## 2017-02-16 DIAGNOSIS — E669 Obesity, unspecified: Secondary | ICD-10-CM | POA: Diagnosis not present

## 2017-02-16 MED ORDER — SODIUM CHLORIDE 0.9 % IV SOLN: 500.0000 mL | Freq: Once | INTRAVENOUS | Status: AC

## 2017-02-16 NOTE — Op Note (Signed)
[facility] Patient Name: Greg Thomas Procedure Date: 02/16/2017 8:59 AM MRN: [id number] Endoscopist: Ladene Artist , MD Age: 67 Referring MD:  Date of Birth: 1950-06-24 Gender: Male Account #: [id number] Procedure:                Colonoscopy Indications:              Surveillance: Personal history of adenomatous                            polyps on last colonoscopy 5 years ago Medicines:                Monitored Anesthesia Care Procedure:                Pre-Anesthesia Assessment:                           - Prior to the procedure, a History and Physical                            was performed, and patient medications and                            allergies were reviewed. The patient's tolerance of                            previous anesthesia was also reviewed. The risks                            and benefits of the procedure and the sedation                            options and risks were discussed with the patient.                            All questions were answered, and informed consent                            was obtained. Prior Anticoagulants: The patient has                            taken no previous anticoagulant or antiplatelet                            agents. ASA Grade Assessment: II - A patient with                            mild systemic disease. After reviewing the risks                            and benefits, the patient was deemed in                            satisfactory condition to undergo the procedure.  After obtaining informed consent, the colonoscope                            was passed under direct vision. Throughout the                            procedure, the patient's blood pressure, pulse, and                            oxygen saturations were monitored continuously. The                            Colonoscope was introduced through the anus and                            advanced to the the cecum,  identified by                            appendiceal orifice and ileocecal valve. The                            ileocecal valve, appendiceal orifice, and rectum                            were photographed. The quality of the bowel                            preparation was excellent. The colonoscopy was                            performed without difficulty. The patient tolerated                            the procedure well. Scope In: 9:10:27 AM Scope Out: 9:23:50 AM Scope Withdrawal Time: 0 hours 10 minutes 48 seconds  Total Procedure Duration: 0 hours 13 minutes 23 seconds  Findings:                 The perianal and digital rectal examinations were                            normal.                           A few medium-mouthed diverticula were found in the                            left colon. There was no evidence of diverticular                            bleeding.                           Internal hemorrhoids were found during  retroflexion. The hemorrhoids were small and Grade                            I (internal hemorrhoids that do not prolapse).                           The exam was otherwise without abnormality on                            direct and retroflexion views. Complications:            No immediate complications. Estimated blood loss:                            None. Estimated Blood Loss:     Estimated blood loss: none. Impression:               - Mild diverticulosis in the left colon. There was                            no evidence of diverticular bleeding.                           - Internal hemorrhoids.                           - The examination was otherwise normal on direct                            and retroflexion views.                           - No specimens collected. Recommendation:           - Repeat colonoscopy in 5 years for surveillance.                           - Patient has a contact number available for                             emergencies. The signs and symptoms of potential                            delayed complications were discussed with the                            patient. Return to normal activities tomorrow.                            Written discharge instructions were provided to the                            patient.                           - High fiber diet.                           -  Continue present medications. Ladene Artist, MD 02/16/2017 9:28:15 AM This report has been signed electronically.

## 2017-02-16 NOTE — Progress Notes (Signed)
Pt's states no medical or surgical changes since previsit or office visit. 

## 2017-02-16 NOTE — Progress Notes (Signed)
Report to PACU, RN, vss, BBS= Clear.  

## 2017-02-16 NOTE — Patient Instructions (Signed)
**  Handouts given on polyps, diverticulosis and high fiber diet, and hemorrhoids**   YOU HAD AN ENDOSCOPIC PROCEDURE TODAY AT [facility]:   Refer to the procedure report that was given to you for any specific questions about what was found during the examination.  If the procedure report does not answer your questions, please call your gastroenterologist to clarify.  If you requested that your care partner not be given the details of your procedure findings, then the procedure report has been included in a sealed envelope for you to review at your convenience later.  YOU SHOULD EXPECT: Some feelings of bloating in the abdomen. Passage of more gas than usual.  Walking can help get rid of the air that was put into your GI tract during the procedure and reduce the bloating. If you had a lower endoscopy (such as a colonoscopy or flexible sigmoidoscopy) you may notice spotting of blood in your stool or on the toilet paper. If you underwent a bowel prep for your procedure, you may not have a normal bowel movement for a few days.  Please Note:  You might notice some irritation and congestion in your nose or some drainage.  This is from the oxygen used during your procedure.  There is no need for concern and it should clear up in a day or so.  SYMPTOMS TO REPORT IMMEDIATELY:   Following lower endoscopy (colonoscopy or flexible sigmoidoscopy):  Excessive amounts of blood in the stool  Significant tenderness or worsening of abdominal pains  Swelling of the abdomen that is new, acute  Fever of 100F or higher   For urgent or emergent issues, a gastroenterologist can be reached at any hour by calling [phone].   DIET:  We do recommend a small meal at first, but then you may proceed to your regular diet.  Drink plenty of fluids but you should avoid alcoholic beverages for 24 hours.  ACTIVITY:  You should plan to take it easy for the rest of today and you should NOT DRIVE or use  heavy machinery until tomorrow (because of the sedation medicines used during the test).    FOLLOW UP: Our staff will call the number listed on your records the next business day following your procedure to check on you and address any questions or concerns that you may have regarding the information given to you following your procedure. If we do not reach you, we will leave a message.  However, if you are feeling well and you are not experiencing any problems, there is no need to return our call.  We will assume that you have returned to your regular daily activities without incident.  If any biopsies were taken you will be contacted by phone or by letter within the next 1-3 weeks.  Please call us at [phone] if you have not heard about the biopsies in 3 weeks.    SIGNATURES/CONFIDENTIALITY: You and/or your care partner have signed paperwork which will be entered into your electronic medical record.  These signatures attest to the fact that that the information above on your After Visit Summary has been reviewed and is understood.  Full responsibility of the confidentiality of this discharge information lies with you and/or your care-partner.

## 2017-02-17 ENCOUNTER — Telehealth: Payer: Self-pay

## 2017-02-17 ENCOUNTER — Telehealth: Payer: Self-pay | Admitting: *Deleted

## 2017-02-17 NOTE — Telephone Encounter (Signed)
Left message

## 2017-02-17 NOTE — Telephone Encounter (Signed)
Called [phone] and left a messaged we tried to reach pt for a follow up call. maw

## 2017-12-01 DIAGNOSIS — R946 Abnormal results of thyroid function studies: Secondary | ICD-10-CM | POA: Diagnosis not present

## 2017-12-01 DIAGNOSIS — R82998 Other abnormal findings in urine: Secondary | ICD-10-CM | POA: Diagnosis not present

## 2017-12-01 DIAGNOSIS — E7849 Other hyperlipidemia: Secondary | ICD-10-CM | POA: Diagnosis not present

## 2017-12-01 DIAGNOSIS — R7301 Impaired fasting glucose: Secondary | ICD-10-CM | POA: Diagnosis not present

## 2017-12-01 DIAGNOSIS — Z125 Encounter for screening for malignant neoplasm of prostate: Secondary | ICD-10-CM | POA: Diagnosis not present

## 2017-12-01 DIAGNOSIS — M109 Gout, unspecified: Secondary | ICD-10-CM | POA: Diagnosis not present

## 2017-12-08 DIAGNOSIS — Z0000 Encounter for general adult medical examination without abnormal findings: Secondary | ICD-10-CM | POA: Diagnosis not present

## 2017-12-08 DIAGNOSIS — Z1212 Encounter for screening for malignant neoplasm of rectum: Secondary | ICD-10-CM | POA: Diagnosis not present

## 2017-12-08 DIAGNOSIS — Z1389 Encounter for screening for other disorder: Secondary | ICD-10-CM | POA: Diagnosis not present

## 2017-12-08 DIAGNOSIS — L308 Other specified dermatitis: Secondary | ICD-10-CM | POA: Diagnosis not present

## 2017-12-08 DIAGNOSIS — E7849 Other hyperlipidemia: Secondary | ICD-10-CM | POA: Diagnosis not present

## 2017-12-08 DIAGNOSIS — M109 Gout, unspecified: Secondary | ICD-10-CM | POA: Diagnosis not present

## 2017-12-08 DIAGNOSIS — F5221 Male erectile disorder: Secondary | ICD-10-CM | POA: Diagnosis not present

## 2017-12-08 DIAGNOSIS — Z6827 Body mass index (BMI) 27.0-27.9, adult: Secondary | ICD-10-CM | POA: Diagnosis not present

## 2017-12-08 DIAGNOSIS — R0683 Snoring: Secondary | ICD-10-CM | POA: Diagnosis not present

## 2017-12-08 DIAGNOSIS — J3089 Other allergic rhinitis: Secondary | ICD-10-CM | POA: Diagnosis not present

## 2017-12-08 DIAGNOSIS — K4090 Unilateral inguinal hernia, without obstruction or gangrene, not specified as recurrent: Secondary | ICD-10-CM | POA: Diagnosis not present

## 2017-12-08 DIAGNOSIS — R7301 Impaired fasting glucose: Secondary | ICD-10-CM | POA: Diagnosis not present

## 2017-12-08 DIAGNOSIS — Z23 Encounter for immunization: Secondary | ICD-10-CM | POA: Diagnosis not present

## 2017-12-08 DIAGNOSIS — E291 Testicular hypofunction: Secondary | ICD-10-CM | POA: Diagnosis not present

## 2018-03-28 DIAGNOSIS — Z6827 Body mass index (BMI) 27.0-27.9, adult: Secondary | ICD-10-CM | POA: Diagnosis not present

## 2018-03-28 DIAGNOSIS — E7849 Other hyperlipidemia: Secondary | ICD-10-CM | POA: Diagnosis not present

## 2018-03-28 DIAGNOSIS — R0789 Other chest pain: Secondary | ICD-10-CM | POA: Diagnosis not present

## 2018-03-28 DIAGNOSIS — M25519 Pain in unspecified shoulder: Secondary | ICD-10-CM | POA: Diagnosis not present

## 2018-03-29 DIAGNOSIS — M25512 Pain in left shoulder: Secondary | ICD-10-CM | POA: Diagnosis not present

## 2018-03-29 DIAGNOSIS — M542 Cervicalgia: Secondary | ICD-10-CM | POA: Diagnosis not present

## 2018-11-01 DIAGNOSIS — Z23 Encounter for immunization: Secondary | ICD-10-CM | POA: Diagnosis not present

## 2019-01-02 DIAGNOSIS — E7849 Other hyperlipidemia: Secondary | ICD-10-CM | POA: Diagnosis not present

## 2019-01-02 DIAGNOSIS — M109 Gout, unspecified: Secondary | ICD-10-CM | POA: Diagnosis not present

## 2019-01-02 DIAGNOSIS — R7301 Impaired fasting glucose: Secondary | ICD-10-CM | POA: Diagnosis not present

## 2019-01-02 DIAGNOSIS — Z125 Encounter for screening for malignant neoplasm of prostate: Secondary | ICD-10-CM | POA: Diagnosis not present

## 2019-01-02 DIAGNOSIS — R946 Abnormal results of thyroid function studies: Secondary | ICD-10-CM | POA: Diagnosis not present

## 2019-01-02 DIAGNOSIS — E291 Testicular hypofunction: Secondary | ICD-10-CM | POA: Diagnosis not present

## 2019-01-16 DIAGNOSIS — L309 Dermatitis, unspecified: Secondary | ICD-10-CM | POA: Diagnosis not present

## 2019-01-16 DIAGNOSIS — M109 Gout, unspecified: Secondary | ICD-10-CM | POA: Diagnosis not present

## 2019-01-16 DIAGNOSIS — Z0000 Encounter for general adult medical examination without abnormal findings: Secondary | ICD-10-CM | POA: Diagnosis not present

## 2019-01-16 DIAGNOSIS — R7301 Impaired fasting glucose: Secondary | ICD-10-CM | POA: Diagnosis not present

## 2019-01-16 DIAGNOSIS — R946 Abnormal results of thyroid function studies: Secondary | ICD-10-CM | POA: Diagnosis not present

## 2019-01-16 DIAGNOSIS — R0683 Snoring: Secondary | ICD-10-CM | POA: Diagnosis not present

## 2019-01-16 DIAGNOSIS — F5221 Male erectile disorder: Secondary | ICD-10-CM | POA: Diagnosis not present

## 2019-01-16 DIAGNOSIS — E785 Hyperlipidemia, unspecified: Secondary | ICD-10-CM | POA: Diagnosis not present

## 2019-01-16 DIAGNOSIS — E291 Testicular hypofunction: Secondary | ICD-10-CM | POA: Diagnosis not present

## 2019-01-16 DIAGNOSIS — G609 Hereditary and idiopathic neuropathy, unspecified: Secondary | ICD-10-CM | POA: Diagnosis not present

## 2019-01-16 DIAGNOSIS — Z1331 Encounter for screening for depression: Secondary | ICD-10-CM | POA: Diagnosis not present

## 2019-01-16 DIAGNOSIS — J309 Allergic rhinitis, unspecified: Secondary | ICD-10-CM | POA: Diagnosis not present

## 2019-04-12 ENCOUNTER — Ambulatory Visit: Payer: Medicare Other | Attending: Internal Medicine

## 2019-04-12 DIAGNOSIS — Z23 Encounter for immunization: Secondary | ICD-10-CM | POA: Insufficient documentation

## 2019-04-12 NOTE — Progress Notes (Signed)
   Covid-19 Vaccination Clinic  Name:  Greg Thomas    MRN: [id number] DOB: Nov 16, 1950  04/12/2019  [name] was observed post Covid-19 immunization for 15 minutes without incidence. He was provided with Vaccine Information Sheet and instruction to access the V-Safe system.   [name] was instructed to call 911 with any severe reactions post vaccine: Marland Kitchen Difficulty breathing  . Swelling of your face and throat  . A fast heartbeat  . A bad rash all over your body  . Dizziness and weakness    Immunizations Administered    Name Date Dose VIS Date Route   Pfizer COVID-19 Vaccine 04/12/2019 10:13 AM 0.3 mL 01/25/2019 Intramuscular   Manufacturer: [place]   Lot: [id number]   [place]: [id number]

## 2019-05-07 ENCOUNTER — Ambulatory Visit: Payer: Medicare Other | Attending: Internal Medicine

## 2019-05-07 DIAGNOSIS — Z23 Encounter for immunization: Secondary | ICD-10-CM

## 2019-05-07 NOTE — Progress Notes (Signed)
   Covid-19 Vaccination Clinic  Name:  Greg Thomas    MRN: [id number] DOB: [birth year]  05/07/2019  Greg Thomas was observed post Covid-19 immunization for 15 minutes without incident. He was provided with Vaccine Information Sheet and instruction to access the V-Safe system.   Greg Thomas was instructed to call 911 with any severe reactions post vaccine: Marland Kitchen Difficulty breathing  . Swelling of face and throat  . A fast heartbeat  . A bad rash all over body  . Dizziness and weakness   Immunizations Administered    Name Date Dose VIS Date Route   Pfizer COVID-19 Vaccine 05/07/2019 12:05 AM 0.3 mL 01/25/2019 Intramuscular   Manufacturer: [place]   Lot: [id number]   [place]: [id number]

## 2020-01-08 ENCOUNTER — Other Ambulatory Visit: Payer: Self-pay | Admitting: Nurse Practitioner

## 2020-01-08 DIAGNOSIS — U071 COVID-19: Secondary | ICD-10-CM

## 2020-01-08 NOTE — Progress Notes (Signed)
I connected by phone with Greg Thomas on 01/08/2020 at 1:34 PM to discuss the potential use of a new treatment for mild to moderate COVID-19 viral infection in non-hospitalized patients.  This patient is a 69 y.o. male that meets the FDA criteria for Emergency Use Authorization of COVID monoclonal antibody casirivimab/imdevimab, bamlanivimab/eteseviamb, or sotrovimab.  Has a (+) direct SARS-CoV-2 viral test result  Has mild or moderate COVID-19   Is NOT hospitalized due to COVID-19  Is within 10 days of symptom onset  Has at least one of the high risk factor(s) for progression to severe COVID-19 and/or hospitalization as defined in EUA.  Specific high risk criteria : BMI > 25 and Cardiovascular disease or hypertension   I have spoken and communicated the following to the patient or parent/caregiver regarding COVID monoclonal antibody treatment:  1. FDA has authorized the emergency use for the treatment of mild to moderate COVID-19 in adults and pediatric patients with positive results of direct SARS-CoV-2 viral testing who are 52 years of age and older weighing at least 40 kg, and who are at high risk for progressing to severe COVID-19 and/or hospitalization.  2. The significant known and potential risks and benefits of COVID monoclonal antibody, and the extent to which such potential risks and benefits are unknown.  3. Information on available alternative treatments and the risks and benefits of those alternatives, including clinical trials.  4. Patients treated with COVID monoclonal antibody should continue to self-isolate and use infection control measures (e.g., wear mask, isolate, social distance, avoid sharing personal items, clean and disinfect "high touch" surfaces, and frequent handwashing) according to CDC guidelines.   5. The patient or parent/caregiver has the option to accept or refuse COVID monoclonal antibody treatment.  After reviewing this information with the patient,  the patient has agreed to receive one of the available covid 19 monoclonal antibodies and will be provided an appropriate fact sheet prior to infusion. Jobe Gibbon, NP 01/08/2020 1:34 PM

## 2020-01-10 ENCOUNTER — Ambulatory Visit (HOSPITAL_COMMUNITY)
Admission: RE | Admit: 2020-01-10 | Discharge: 2020-01-10 | Disposition: A | Discharge status: Home / Self Care | Payer: Medicare Other | Source: Ambulatory Visit | Attending: Pulmonary Disease | Admitting: Pulmonary Disease

## 2020-01-10 ENCOUNTER — Other Ambulatory Visit (HOSPITAL_COMMUNITY): Payer: Self-pay

## 2020-01-10 DIAGNOSIS — Z23 Encounter for immunization: Secondary | ICD-10-CM | POA: Diagnosis not present

## 2020-01-10 DIAGNOSIS — R54 Age-related physical debility: Secondary | ICD-10-CM | POA: Insufficient documentation

## 2020-01-10 DIAGNOSIS — I10 Essential (primary) hypertension: Secondary | ICD-10-CM | POA: Insufficient documentation

## 2020-01-10 DIAGNOSIS — U071 COVID-19: Secondary | ICD-10-CM | POA: Insufficient documentation

## 2020-01-10 MED ORDER — SODIUM CHLORIDE 0.9 % IV SOLN: INTRAVENOUS | Status: DC | PRN

## 2020-01-10 MED ORDER — EPINEPHRINE 0.3 MG/0.3ML IJ SOAJ: 0.3000 mg | Freq: Once | INTRAMUSCULAR | Status: DC | PRN

## 2020-01-10 MED ORDER — SOTROVIMAB 500 MG/8ML IV SOLN
500.0000 mg | Freq: Once | INTRAVENOUS | Status: AC
  Administered 2020-01-10: 500 mg via INTRAVENOUS

## 2020-01-10 MED ORDER — FAMOTIDINE IN NACL 20-0.9 MG/50ML-% IV SOLN: 20.0000 mg | Freq: Once | INTRAVENOUS | Status: DC | PRN

## 2020-01-10 MED ORDER — METHYLPREDNISOLONE SODIUM SUCC 125 MG IJ SOLR: 125.0000 mg | Freq: Once | INTRAMUSCULAR | Status: DC | PRN

## 2020-01-10 MED ORDER — DIPHENHYDRAMINE HCL 50 MG/ML IJ SOLN: 50.0000 mg | Freq: Once | INTRAMUSCULAR | Status: DC | PRN

## 2020-01-10 MED ORDER — ALBUTEROL SULFATE HFA 108 (90 BASE) MCG/ACT IN AERS: 2.0000 | INHALATION_SPRAY | Freq: Once | RESPIRATORY_TRACT | Status: DC | PRN

## 2020-01-10 NOTE — Progress Notes (Signed)
Diagnosis: COVID-19  Physician: Dr. Patrick Wright  Procedure: Covid Infusion Clinic Med: Sotrovimab infusion - Provided patient with sotrovimab fact sheet for patients, parents, and caregivers prior to infusion.   Complications: No immediate complications noted  Discharge: Discharged home    

## 2020-01-10 NOTE — Progress Notes (Signed)
1030  Patient reviewed Fact Sheet for Patients, Parents, and Caregivers for Emergency Use Authorization (EUA) of Sotrovimab for the Treatment of Coronavirus. Patient also reviewed and is agreeable to the estimated cost of treatment. Patient is agreeable to proceed.

## 2020-01-10 NOTE — Discharge Instructions (Signed)

## 2020-01-29 ENCOUNTER — Other Ambulatory Visit: Payer: Self-pay | Admitting: Internal Medicine

## 2020-01-29 DIAGNOSIS — E785 Hyperlipidemia, unspecified: Secondary | ICD-10-CM

## 2020-02-20 ENCOUNTER — Other Ambulatory Visit: Payer: Self-pay | Admitting: Internal Medicine

## 2020-02-20 ENCOUNTER — Ambulatory Visit
Admission: RE | Admit: 2020-02-20 | Discharge: 2020-02-20 | Disposition: A | Discharge status: Home / Self Care | Payer: No Typology Code available for payment source | Source: Ambulatory Visit | Attending: Internal Medicine | Admitting: Internal Medicine

## 2020-02-20 DIAGNOSIS — E785 Hyperlipidemia, unspecified: Secondary | ICD-10-CM

## 2020-10-07 ENCOUNTER — Encounter: Payer: Self-pay | Admitting: Nurse Practitioner

## 2020-11-04 ENCOUNTER — Other Ambulatory Visit: Payer: Self-pay

## 2020-11-05 ENCOUNTER — Ambulatory Visit: Payer: Medicare Other | Admitting: Nurse Practitioner

## 2020-11-05 ENCOUNTER — Other Ambulatory Visit (INDEPENDENT_AMBULATORY_CARE_PROVIDER_SITE_OTHER): Payer: Medicare Other

## 2020-11-05 ENCOUNTER — Encounter: Payer: Self-pay | Admitting: Nurse Practitioner

## 2020-11-05 VITALS — BP 112/68 | HR 80 | Ht 73.0 in | Wt 203.5 lb

## 2020-11-05 DIAGNOSIS — K921 Melena: Secondary | ICD-10-CM

## 2020-11-05 DIAGNOSIS — R1032 Left lower quadrant pain: Secondary | ICD-10-CM

## 2020-11-05 DIAGNOSIS — Z86010 Personal history of colonic polyps: Secondary | ICD-10-CM

## 2020-11-05 LAB — CBC WITH DIFFERENTIAL/PLATELET
Basophils Absolute: 0 10*3/uL (ref 0.0–0.1)
Basophils Relative: 0.7 % (ref 0.0–3.0)
Eosinophils Absolute: 0.2 10*3/uL (ref 0.0–0.7)
Eosinophils Relative: 3.6 % (ref 0.0–5.0)
HCT: 42.3 % (ref 39.0–52.0)
Hemoglobin: 14 g/dL (ref 13.0–17.0)
Lymphocytes Relative: 21.6 % (ref 12.0–46.0)
Lymphs Abs: 1.2 10*3/uL (ref 0.7–4.0)
MCHC: 33 g/dL (ref 30.0–36.0)
MCV: 92 fl (ref 78.0–100.0)
Monocytes Absolute: 0.4 10*3/uL (ref 0.1–1.0)
Monocytes Relative: 7.1 % (ref 3.0–12.0)
Neutro Abs: 3.9 10*3/uL (ref 1.4–7.7)
Neutrophils Relative %: 67 % (ref 43.0–77.0)
Platelets: 177 10*3/uL (ref 150.0–400.0)
RBC: 4.6 Mil/uL (ref 4.22–5.81)
RDW: 14.2 % (ref 11.5–15.5)
WBC: 5.8 10*3/uL (ref 4.0–10.5)

## 2020-11-05 LAB — COMPREHENSIVE METABOLIC PANEL
ALT: 27 U/L (ref 0–53)
AST: 24 U/L (ref 0–37)
Albumin: 4.4 g/dL (ref 3.5–5.2)
Alkaline Phosphatase: 71 U/L (ref 39–117)
BUN: 12 mg/dL (ref 6–23)
CO2: 29 mEq/L (ref 19–32)
Calcium: 9.3 mg/dL (ref 8.4–10.5)
Chloride: 103 mEq/L (ref 96–112)
Creatinine, Ser: 1.03 mg/dL (ref 0.40–1.50)
GFR: 74.13 mL/min (ref >60.00)
Glucose, Bld: 99 mg/dL (ref 70–99)
Potassium: 4.3 mEq/L (ref 3.5–5.1)
Sodium: 140 mEq/L (ref 135–145)
Total Bilirubin: 0.6 mg/dL (ref 0.2–1.2)
Total Protein: 7 g/dL (ref 6.0–8.3)

## 2020-11-05 NOTE — Progress Notes (Signed)
11/05/2020 Greg Thomas [id number] [birth year]   CHIEF COMPLAINT: Left lower abdominal pain   HISTORY OF PRESENT ILLNESS:  Greg Thomas is a 70 year old male with a past medical history of hyperlipidemia and colon polyps. Covid 19 infection 12/2017.  He presents to our office today self-referred for further evaluation regarding LLQ pain.  He developed LLQ pain x 5 to 7 days and he also noted passing loose black tarry stools x 1 episode daily for 2 consecutive days which occurred 3 months ago.  No fever.  No associated Pepto-Bismol or oral iron use.  He had recurrence of LLQ pain approximately 4 weeks ago without passing any black tarry stools.  His LLQ pain lasted 5 to 7 days then resolved. He denies having any further LLQ pain for the past 3 weeks.  He typically passes a normal formed brown bowel movement in the morning and sometimes passes a second BM in the afternoon.  His appetite is good.  No weight loss.  He has a history of an 8 mm tubular adenomatous polyp which was removed from the cecum during a colonoscopy in 2013.  His most recent colonoscopy by Dr. Fuller Plan was 02/16/2017 which showed diverticulosis in the left colon, no polyps were identified.  He was advised to repeat a colonoscopy in 5 years.  No known family history of colorectal cancer. He denies having any current upper abdominal pain. No dysphagia or heartburn. No N/V. He denies taking ASA. He takes Aleve one tab once every 2 to 3 months for aches and pains. No history of GERD or ulcers. He denies ever having an EGD.   Colonoscopy 02/16/2017: - Mild diverticulosis in the left colon. There was no evidence of diverticular bleeding. - Internal hemorrhoids. - The examination was otherwise normal on direct and retroflexion views. - No specimens collected. - 5 year recall   Colonoscopy 12/09/2011: - 69mm polyp removed from the cecum  - Internal hemorrhoids  - TUBULAR ADENOMA, FRAGMENTED. - NO HIGH GRADE DYSPLASIA OR MALIGNANCY  IDENTIFIED  Colonoscopy 09/22/2001: -Internal hemorrhoids -No polyps   Past Medical History:  Diagnosis Date   Allergy    dust mites   Anal itch    Hyperlipidemia    Sinusitis, chronic    Past Surgical History:  Procedure Laterality Date   CARPAL TUNNEL RELEASE  2006   right   ETHMOIDECTOMY Bilateral 12/15/2016   Procedure: ETHMOIDECTOMY WITH FRONTAL SINUS EXPLORATION;  Surgeon: Margaretha Sheffield, MD;  Location: [facility];  Service: ENT;  Laterality: Bilateral;   IMAGE GUIDED SINUS SURGERY Bilateral 12/15/2016   Procedure: IMAGE GUIDED SINUS SURGERY  need disk;  Surgeon: Margaretha Sheffield, MD;  Location: [facility];  Service: ENT;  Laterality: Bilateral;  GAVE DISK TO CECE 10-29 KP   KNEE ARTHROSCOPY  2004   right   MAXILLARY ANTROSTOMY Bilateral 12/15/2016   Procedure: MAXILLARY ANTROSTOMY WITH REMOVAL OF TISSUE FROM SINUS;  Surgeon: Margaretha Sheffield, MD;  Location: [facility];  Service: ENT;  Laterality: Bilateral;   SEPTOPLASTY N/A 12/15/2016   Procedure: SEPTOPLASTY;  Surgeon: Margaretha Sheffield, MD;  Location: [facility];  Service: ENT;  Laterality: N/A;   SPHENOIDECTOMY Bilateral 12/15/2016   Procedure: Coralee Pesa;  Surgeon: Margaretha Sheffield, MD;  Location: [facility];  Service: ENT;  Laterality: Bilateral;   TURBINATE REDUCTION Bilateral 12/15/2016   Procedure: INFERIOR TURBINATE REDUCTION;  Surgeon: Margaretha Sheffield, MD;  Location: [facility];  Service: ENT;  Laterality: Bilateral;  Social History: He is married.  He has 1 daughter.  He is retired.  He smoked 1ppd x 23 years, quit smoking 1987. He drinks 5 beers daily.   Family History: Father deceased age 93 with heart disease, brain hemorrhage.  Mother living with Alzheimer's disease. Sister with COPD. Brother with arthritis.   Outpatient Encounter Medications as of 11/05/2020  Medication Sig   allopurinol (ZYLOPRIM) 300 MG tablet Take 300 mg by mouth daily.   Coenzyme Q10 (COQ10) 100 MG  CAPS    desoximetasone (TOPICORT) 0.25 % cream Apply 1 application topically as needed.   fluticasone (FLONASE) 50 MCG/ACT nasal spray Place 2 sprays into the nose daily.   hydrocortisone (ANUSOL-HC) 2.5 % rectal cream Place rectally as needed.   Multiple Vitamins-Minerals (MULTIVITAMIN WITH MINERALS) tablet Take 1 tablet by mouth daily.   rosuvastatin (CRESTOR) 20 MG tablet Take 20 mg by mouth daily.   Facility-Administered Encounter Medications as of 11/05/2020  Medication   0.9 %  sodium chloride infusion    REVIEW OF SYSTEMS:  Gen: Denies fever, sweats or chills. No weight loss.  CV: Denies chest pain, palpitations or edema. Resp: + Cough, allergies. Denies shortness of breath of hemoptysis.  GI: See HPI. GU : Denies urinary burning, blood in urine, increased urinary frequency or incontinence. MS: + Back pain. Derm: Denies rash, itchiness, skin lesions or unhealing ulcers. Psych: Denies depression, anxiety, memory loss, suicidal ideation and confusion. Heme: Denies bruising, bleeding. Neuro:  Denies headaches, dizziness or paresthesias. Endo:  Denies any problems with DM, thyroid or adrenal function.   PHYSICAL EXAM: BP 112/68   Pulse 80   Ht 6\' 1"  (1.854 m)   Wt 203 lb 8 oz (92.3 kg)   BMI 26.85 kg/m   General: 70 year old male in no acute distress. Head: Normocephalic and atraumatic. Eyes:  Sclerae non-icteric, conjunctive pink. Ears: Normal auditory acuity. Mouth: Dentition intact. No ulcers or lesions.  Neck: Supple, no lymphadenopathy or thyromegaly.  Lungs: Clear bilaterally to auscultation without wheezes, crackles or rhonchi. Heart: Regular rate and rhythm. No murmur, rub or gallop appreciated.  Abdomen: Soft, nontender, non distended. No masses. No hepatosplenomegaly. Normoactive bowel sounds x 4 quadrants.  Rectal: Deferred.  Musculoskeletal: Symmetrical with no gross deformities. Skin: Warm and dry. No rash or lesions on visible extremities. Extremities: No  edema. Neurological: Alert oriented x 4, no focal deficits.  Psychological:  Alert and cooperative. Normal mood and affect.  ASSESSMENT AND PLAN:  103) 70 year old male with a history of left colon diverticulosis presents with LLQ pain x 5 to 7 days which occurred 3 months ago and 4 weeks ago, resolved without treatment.  -CBC, CMP. Labs to be completed prior to proceeding with CTAP. -CTAP with oral and IV contrast  -Await further recommendations regarding endoscopic evaluation after CTAP completed and results reviewed.   2) Melena x 2 days three months ago without recurrence. No prior history of PUD or GI bleed. No GERD symptoms. Infrequent NSAID use.  -? Future EGD  3) History of a tubular adenomatous colon polyp per colonoscopy 11/2011, no polyps per colonoscopy 02/2017 -Recall colonoscopy due 02/2022 -See plan in # 1  4) Alcohol over use -Advised patient to reduce alcohol intake     CC:  Crist Infante, MD

## 2020-11-05 NOTE — Patient Instructions (Addendum)
  IMAGING: You will be contacted by [facility] Scheduling (Your caller ID will indicate phone # [phone]) in the next 2 days to schedule your Abdominal CT Scan. If you have not heard from them within 2 business days, please call [facility] at [phone] to follow up on the status of your appointment.    LABS:  Lab work has been ordered for you today. Our lab is located in the basement. Press "B" on the elevator. The lab is located at the first door on the left as you exit the elevator.  HEALTHCARE LAWS AND MY CHART RESULTS: Due to recent changes in healthcare laws, you may see the results of your imaging and laboratory studies on MyChart before your provider has had a chance to review them.   We understand that in some cases there may be results that are confusing or concerning to you. Not all laboratory results come back in the same time frame and the provider may be waiting for multiple results in order to interpret others.  Please give [place] 48 hours in order for your provider to thoroughly review all the results before contacting the office for clarification of your results.   RECOMMENDATIONS: Further recommendations to be determined after labs and CT scan. Please call our office if lower abdominal pain or black stools recur. Go to the emergency room if you develop severe abdominal pain.  It was great seeing you today! Thank you for entrusting me with your care and choosing [facility].  Noralyn Pick, CRNP  If you are age 44 or older, your body mass index should be between 23-30. Your Body mass index is 26.85 kg/m. If this is out of the aforementioned range listed, please consider follow up with your Primary Care Provider.  The [facility] providers would like to encourage you to use [facility] to communicate with providers for non-urgent requests or questions.  Due to long hold times on the telephone, sending your provider a message by  [facility] may be faster and more efficient way to get a response. Please allow 48 business hours for a response.  Please remember that this is for non-urgent requests/questions.

## 2020-11-05 NOTE — Progress Notes (Signed)
RADIOLOGY SCHEDULING REQUEST FOR CT Abd pelvis [facility] Scheduling via secure staff message.

## 2020-11-06 NOTE — Progress Notes (Signed)
Reviewed and agree with management plan.  Josian Lanese T. Chritopher Coster, MD FACG 

## 2020-11-20 ENCOUNTER — Other Ambulatory Visit: Payer: Self-pay

## 2020-11-20 ENCOUNTER — Ambulatory Visit (HOSPITAL_COMMUNITY)
Admission: RE | Admit: 2020-11-20 | Discharge: 2020-11-20 | Disposition: A | Discharge status: Home / Self Care | Payer: Medicare Other | Source: Ambulatory Visit | Attending: Nurse Practitioner | Admitting: Nurse Practitioner

## 2020-11-20 ENCOUNTER — Encounter (HOSPITAL_COMMUNITY): Payer: Self-pay

## 2020-11-20 DIAGNOSIS — R1032 Left lower quadrant pain: Secondary | ICD-10-CM | POA: Diagnosis not present

## 2020-11-20 DIAGNOSIS — Z86010 Personal history of colonic polyps: Secondary | ICD-10-CM | POA: Diagnosis present

## 2020-11-20 DIAGNOSIS — K921 Melena: Secondary | ICD-10-CM | POA: Diagnosis present

## 2020-11-20 MED ORDER — IOHEXOL 350 MG/ML SOLN
80.0000 mL | Freq: Once | INTRAVENOUS | Status: AC | PRN
  Administered 2020-11-20: 80 mL via INTRAVENOUS

## 2021-10-31 IMAGING — CT CT CARDIAC CORONARY ARTERY CALCIUM SCORE
4 series · 12 of 20 positions shown, 14 images · non-contrast
Comparison: No priors.

CLINICAL DATA: 69-year-old Caucasian male with elevated cholesterol
and family history of heart disease. Former smoker. Evaluate for
coronary artery disease.

EXAM:
CT CARDIAC CORONARY ARTERY CALCIUM SCORE
TECHNIQUE: Non-contrast imaging through the heart was performed using
prospective ECG gating. Image post processing was performed on an
independent workstation, allowing for quantitative analysis of the
heart and coronary arteries. Note that this exam targets the heart
and the chest was not imaged in its entirety.

[Series 2: calcium scoring 2.00 qr36 bestdiast 71% hrt calciu · axial · 0.39mm/px · z∈[+1632,+1678]mm · 2 of 70 slices shown]
[im 24/70  vessel]
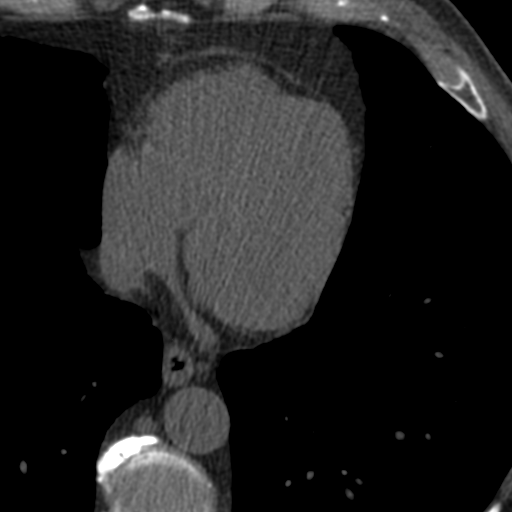
[im 47/70  vessel]
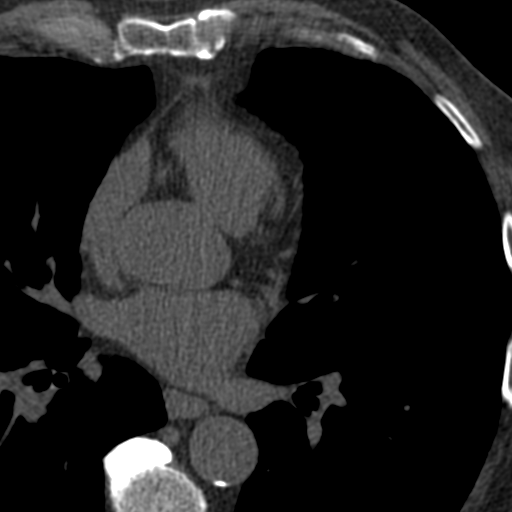

[Series 3: calcium scoring 2.00 br40 bestdiast 71% axial · axial · 0.61mm/px · z∈[+1632,+1678]mm · 2 of 70 slices shown]
[im 24/70  vessel]
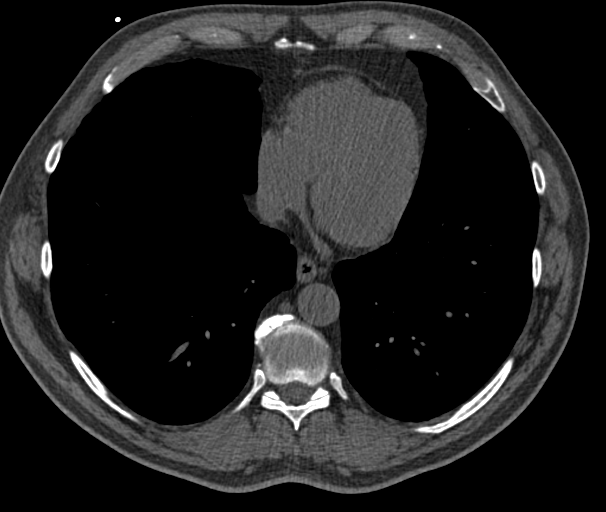
[im 47/70  vessel]
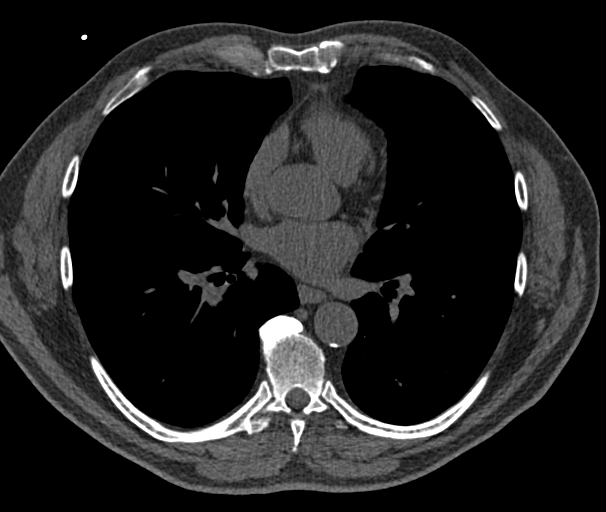

[Series 9: calcium scoring 2.00 br60 bestdiast 71% lungs · axial · 0.61mm/px · z∈[+1632,+1678]mm · 2 of 70 slices shown]
[im 24/70  vessel]
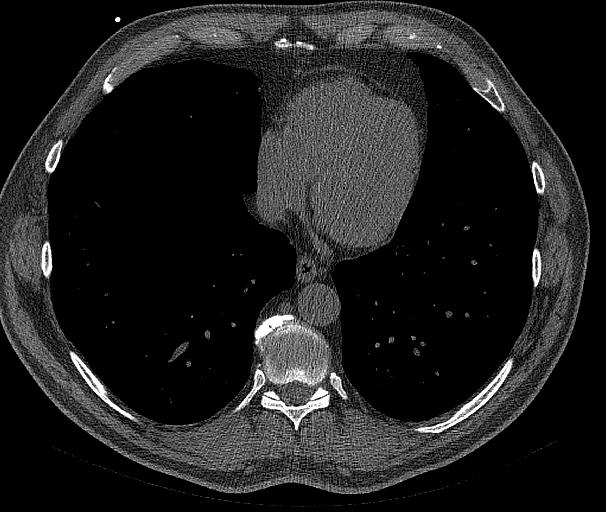
[im 47/70  vessel]
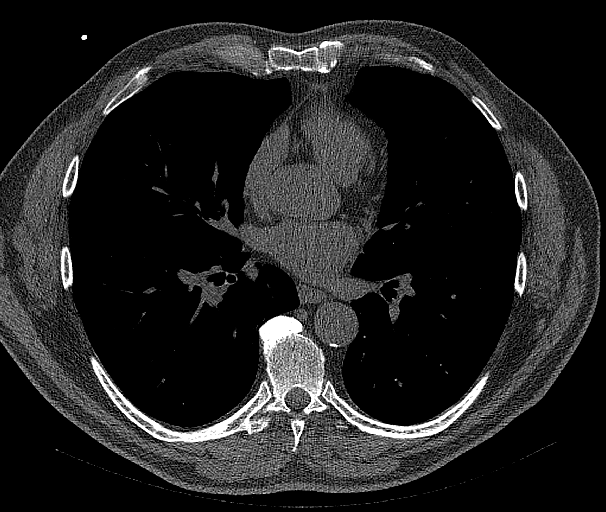

[Series 11: calcium scoring 1.50 qr36 bestdiast 71% thins · axial · 0.39mm/px · z∈[+1605,+1704]mm · 6 of 139 slices shown, 8 images]
[im 20/139  vessel]
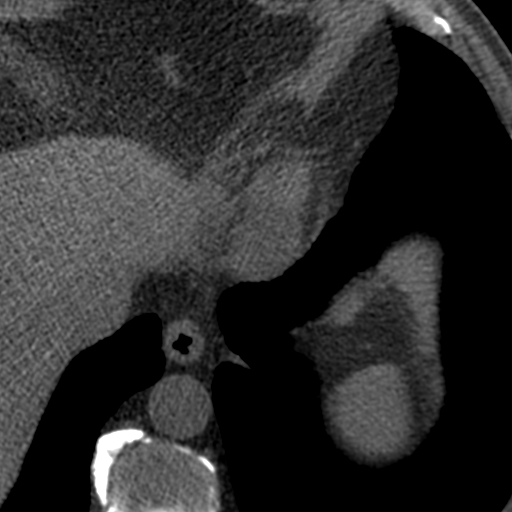
[im 20/139  lung]
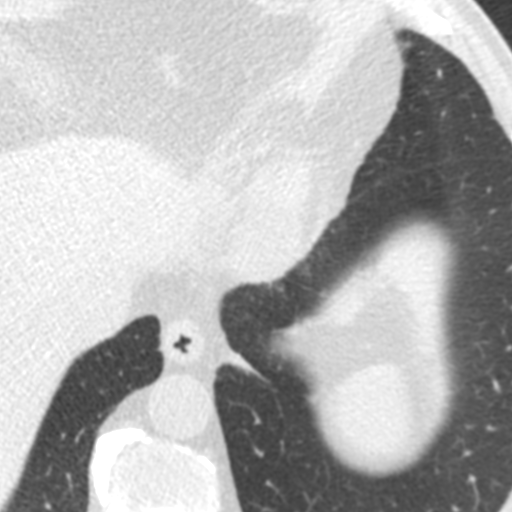
[im 40/139  vessel]
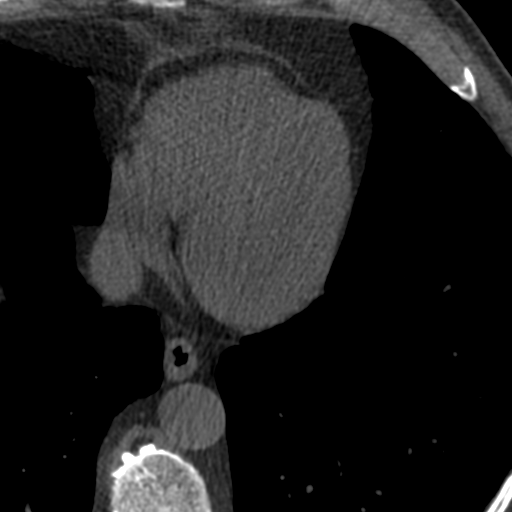
[im 60/139  vessel]
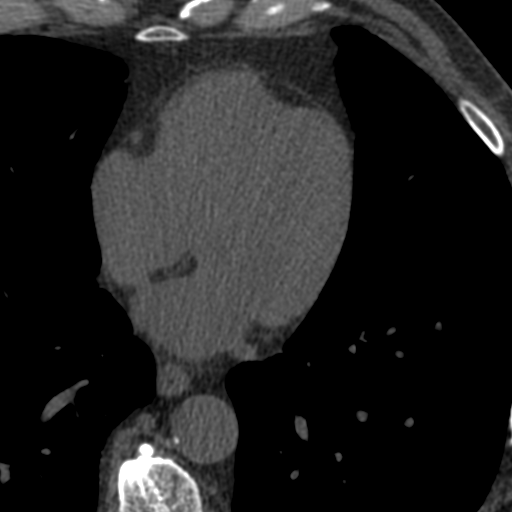
[im 79/139  vessel]
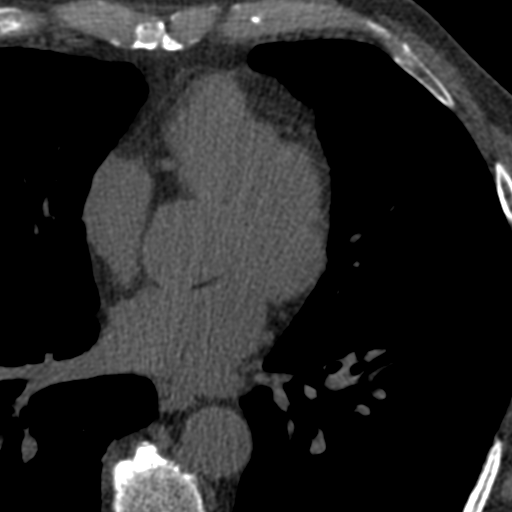
[im 99/139  vessel]
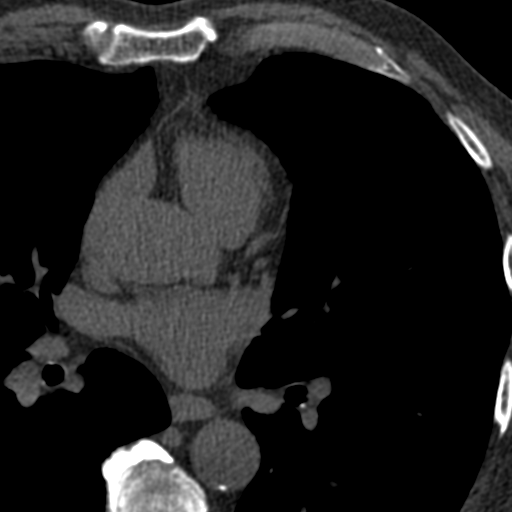
[im 99/139  lung]
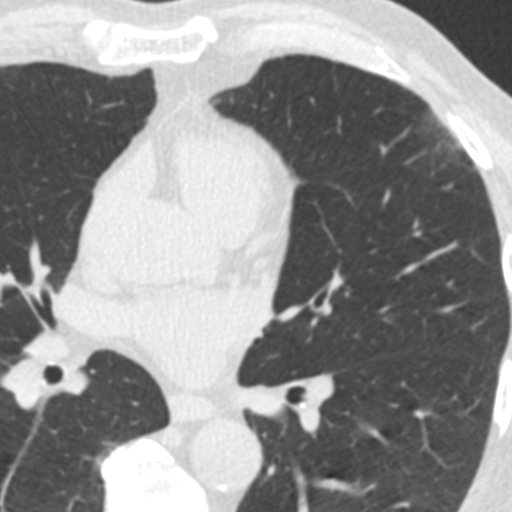
[im 119/139  vessel]
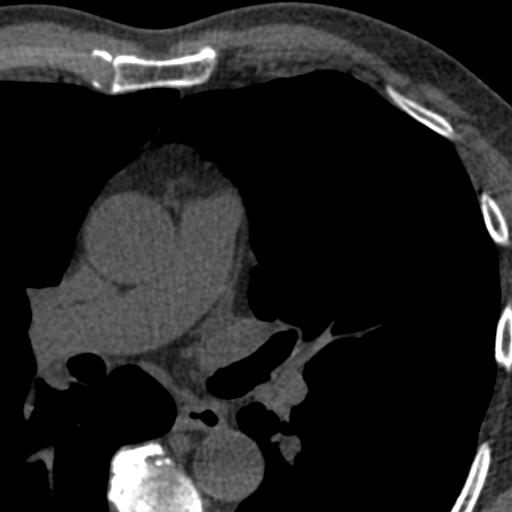

[12 of 20 positions shown; findings below may reference images not displayed]

FINDINGS: CORONARY CALCIUM SCORES:

Left Main: 0

LAD: 7

LCx: 0

RCA: 36

Total Agatston Score: 43

[HOSPITAL] percentile: 35th

AORTA MEASUREMENTS:

Ascending Aorta: 36 mm

Descending Aorta: 28 mm

OTHER FINDINGS:

Within the visualized portions of the thorax there are no suspicious
appearing pulmonary nodules or masses, there is no acute
consolidative airspace disease, no pleural effusions, no
pneumothorax and no lymphadenopathy. Visualized portions of the
upper abdomen are unremarkable. There are no aggressive appearing
lytic or blastic lesions noted in the visualized portions of the
skeleton.
IMPRESSION: 1. Patient's total coronary artery calcium score is 43 which is 35th
percentile for patient's of matched age, gender and race/ethnicity.
Please note that although the presence of coronary artery calcium
documents the presence of coronary artery disease, the severity of
this disease and any potential stenosis cannot be assessed on this
noncontrast CT examination. Assessment for potential risk factor
modification, dietary therapy or pharmacologic therapy may be
warranted, if clinically indicated.
2. No significant incidental noncardiac findings are noted.

## 2022-03-23 ENCOUNTER — Encounter: Payer: Self-pay | Admitting: Gastroenterology

## 2022-04-01 ENCOUNTER — Encounter: Payer: Self-pay | Admitting: Gastroenterology

## 2022-04-25 ENCOUNTER — Encounter: Payer: Self-pay | Admitting: Gastroenterology

## 2022-04-25 ENCOUNTER — Ambulatory Visit (AMBULATORY_SURGERY_CENTER): Payer: Medicare Other

## 2022-04-25 VITALS — Ht 73.0 in | Wt 208.0 lb

## 2022-04-25 DIAGNOSIS — Z86010 Personal history of colonic polyps: Secondary | ICD-10-CM

## 2022-04-25 MED ORDER — NA SULFATE-K SULFATE-MG SULF 17.5-3.13-1.6 GM/177ML PO SOLN: 1.0000 | Freq: Once | ORAL | 0 refills | Status: AC

## 2022-04-25 NOTE — Progress Notes (Signed)
No egg or soy allergy known to patient  No issues known to pt with past sedation with any surgeries or procedures Patient denies ever being told they had issues or difficulty with intubation  No FH of Malignant Hyperthermia Pt is not on diet pills Pt is not on  home 02  Pt is not on blood thinners  Pt denies issues with constipation  No A fib or A flutter Have any cardiac testing pending--no Pt instructed to use [email or web address] or GoodRx for a price reduction on prep   

## 2022-05-19 ENCOUNTER — Encounter: Payer: Self-pay | Admitting: Gastroenterology

## 2022-05-19 ENCOUNTER — Ambulatory Visit (AMBULATORY_SURGERY_CENTER): Payer: Medicare Other | Admitting: Gastroenterology

## 2022-05-19 VITALS — BP 112/71 | HR 69 | Temp 95.9°F | Resp 12 | Ht 73.0 in | Wt 208.0 lb

## 2022-05-19 DIAGNOSIS — Z09 Encounter for follow-up examination after completed treatment for conditions other than malignant neoplasm: Secondary | ICD-10-CM | POA: Diagnosis not present

## 2022-05-19 DIAGNOSIS — Z86010 Personal history of colonic polyps: Secondary | ICD-10-CM | POA: Diagnosis not present

## 2022-05-19 MED ORDER — SODIUM CHLORIDE 0.9 % IV SOLN: 500.0000 mL | Freq: Once | INTRAVENOUS | Status: DC

## 2022-05-19 NOTE — Op Note (Signed)
[facility] Patient Name: Greg Thomas Procedure Date: 05/19/2022 7:55 AM MRN: [id number] Endoscopist: Ladene Artist , MD, [id number] Age: 72 Referring MD:  Date of Birth: [birth year] Gender: Male Account #: [id number] Procedure:                Colonoscopy Indications:              Surveillance: Personal history of adenomatous                            polyps, last colonoscopy 5 years ago Medicines:                Monitored Anesthesia Care Procedure:                Pre-Anesthesia Assessment:                           - Prior to the procedure, a History and Physical                            was performed, and patient medications and                            allergies were reviewed. The patient's tolerance of                            previous anesthesia was also reviewed. The risks                            and benefits of the procedure and the sedation                            options and risks were discussed with the patient.                            All questions were answered, and informed consent                            was obtained. Prior Anticoagulants: The patient has                            taken no anticoagulant or antiplatelet agents. ASA                            Grade Assessment: II - A patient with mild systemic                            disease. After reviewing the risks and benefits,                            the patient was deemed in satisfactory condition to                            undergo the procedure.  After obtaining informed consent, the colonoscope                            was passed under direct vision. Throughout the                            procedure, the patient's blood pressure, pulse, and                            oxygen saturations were monitored continuously. The                            CF [id number] was introduced through the anus                            and advanced to the the  cecum, identified by                            appendiceal orifice and ileocecal valve. The                            ileocecal valve, appendiceal orifice, and rectum                            were photographed. The quality of the bowel                            preparation was excellent. The colonoscopy was                            performed without difficulty. The patient tolerated                            the procedure well. Scope In: 8:04:38 AM Scope Out: 8:18:27 AM Scope Withdrawal Time: 0 hours 11 minutes 22 seconds  Total Procedure Duration: 0 hours 13 minutes 49 seconds  Findings:                 The perianal and digital rectal examinations were                            normal.                           Multiple medium-mouthed and small-mouthed                            diverticula were found in the left colon. There was                            no evidence of diverticular bleeding.                           External hemorrhoids were found during  retroflexion. The hemorrhoids were small.                           The exam was otherwise without abnormality on                            direct and retroflexion views. Complications:            No immediate complications. Estimated blood loss:                            None. Estimated Blood Loss:     Estimated blood loss: none. Impression:               - Mild diverticulosis in the left colon.                           - External hemorrhoids.                           - The examination was otherwise normal on direct                            and retroflexion views.                           - No specimens collected. Recommendation:           - Patient has a contact number available for                            emergencies. The signs and symptoms of potential                            delayed complications were discussed with the                            patient. Return to normal  activities tomorrow.                            Written discharge instructions were provided to the                            patient.                           - High fiber diet.                           - Continue present medications.                           - No repeat colonoscopy due to age and the absence                            of colonic polyps. Ladene Artist, MD 05/19/2022 8:22:38 AM This report has been signed electronically.

## 2022-05-19 NOTE — Progress Notes (Signed)
Pt's states no medical or surgical changes since previsit or office visit. 

## 2022-05-19 NOTE — Patient Instructions (Signed)
Please read handouts provided. Continue present medications. High Fiber Diet.   YOU HAD AN ENDOSCOPIC PROCEDURE TODAY AT [facility]:   Refer to the procedure report that was given to you for any specific questions about what was found during the examination.  If the procedure report does not answer your questions, please call your gastroenterologist to clarify.  If you requested that your care partner not be given the details of your procedure findings, then the procedure report has been included in a sealed envelope for you to review at your convenience later.  YOU SHOULD EXPECT: Some feelings of bloating in the abdomen. Passage of more gas than usual.  Walking can help get rid of the air that was put into your GI tract during the procedure and reduce the bloating. If you had a lower endoscopy (such as a colonoscopy or flexible sigmoidoscopy) you may notice spotting of blood in your stool or on the toilet paper. If you underwent a bowel prep for your procedure, you may not have a normal bowel movement for a few days.  Please Note:  You might notice some irritation and congestion in your nose or some drainage.  This is from the oxygen used during your procedure.  There is no need for concern and it should clear up in a day or so.  SYMPTOMS TO REPORT IMMEDIATELY:  Following lower endoscopy (colonoscopy or flexible sigmoidoscopy):  Excessive amounts of blood in the stool  Significant tenderness or worsening of abdominal pains  Swelling of the abdomen that is new, acute  Fever of 100F or higher  For urgent or emergent issues, a gastroenterologist can be reached at any hour by calling [phone]. Do not use MyChart messaging for urgent concerns.    DIET:  We do recommend a small meal at first, but then you may proceed to your regular diet.  Drink plenty of fluids but you should avoid alcoholic beverages for 24 hours.  ACTIVITY:  You should plan to take it easy for the rest  of today and you should NOT DRIVE or use heavy machinery until tomorrow (because of the sedation medicines used during the test).    FOLLOW UP: Our staff will call the number listed on your records the next business day following your procedure.  We will call around 7:15- 8:00 am to check on you and address any questions or concerns that you may have regarding the information given to you following your procedure. If we do not reach you, we will leave a message.     If any biopsies were taken you will be contacted by phone or by letter within the next 1-3 weeks.  Please call us at [phone] if you have not heard about the biopsies in 3 weeks.    SIGNATURES/CONFIDENTIALITY: You and/or your care partner have signed paperwork which will be entered into your electronic medical record.  These signatures attest to the fact that that the information above on your After Visit Summary has been reviewed and is understood.  Full responsibility of the confidentiality of this discharge information lies with you and/or your care-partner.

## 2022-05-19 NOTE — Progress Notes (Signed)
History & Physical  Primary Care Physician:  Crist Infante, MD Primary Gastroenterologist: Lucio Edward, MD  Impression / Plan:  Personal history of adenomatous colon polyps for surveillance colonoscopy.  CHIEF COMPLAINT:  Personal history of colon polyps   HPI: Greg Thomas is a 72 y.o. male with a personal history of adenomatous colon polyps for surveillance colonoscopy.   Past Medical History:  Diagnosis Date   Allergy    dust mites   Anal itch    Hyperlipidemia    Sinusitis, chronic     Past Surgical History:  Procedure Laterality Date   CARPAL TUNNEL RELEASE  2006   right   ETHMOIDECTOMY Bilateral 12/15/2016   Procedure: ETHMOIDECTOMY WITH FRONTAL SINUS EXPLORATION;  Surgeon: Margaretha Sheffield, MD;  Location: [place];  Service: ENT;  Laterality: Bilateral;   IMAGE GUIDED SINUS SURGERY Bilateral 12/15/2016   Procedure: IMAGE GUIDED SINUS SURGERY  need disk;  Surgeon: Margaretha Sheffield, MD;  Location: [place];  Service: ENT;  Laterality: Bilateral;  GAVE DISK TO [facility] 10-29 KP   KNEE ARTHROSCOPY  2004   right   MAXILLARY ANTROSTOMY Bilateral 12/15/2016   Procedure: MAXILLARY ANTROSTOMY WITH REMOVAL OF TISSUE FROM SINUS;  Surgeon: Margaretha Sheffield, MD;  Location: [place];  Service: ENT;  Laterality: Bilateral;   SEPTOPLASTY N/A 12/15/2016   Procedure: SEPTOPLASTY;  Surgeon: Margaretha Sheffield, MD;  Location: [place];  Service: ENT;  Laterality: N/A;   SPHENOIDECTOMY Bilateral 12/15/2016   Procedure: Coralee Pesa;  Surgeon: Margaretha Sheffield, MD;  Location: [place];  Service: ENT;  Laterality: Bilateral;   TURBINATE REDUCTION Bilateral 12/15/2016   Procedure: INFERIOR TURBINATE REDUCTION;  Surgeon: Margaretha Sheffield, MD;  Location: [place];  Service: ENT;  Laterality: Bilateral;    Prior to Admission medications   Medication Sig Start Date End Date Taking? Authorizing Provider  allopurinol (ZYLOPRIM) 300 MG tablet Take 300  mg by mouth daily.   Yes [provider]  Coenzyme Q10 (COQ10) 100 MG CAPS  02/15/20  Yes [provider]  Cyanocobalamin (B-12) 1000 MCG TABS  04/02/22  Yes [provider]  fluticasone (FLONASE) 50 MCG/ACT nasal spray Place 2 sprays into the nose daily.   Yes [provider]  Multiple Vitamins-Minerals (MULTIVITAMIN WITH MINERALS) tablet Take 1 tablet by mouth daily.   Yes [provider]  rosuvastatin (CRESTOR) 20 MG tablet Take 20 mg by mouth daily.   Yes [provider]  desoximetasone (TOPICORT) 0.25 % cream Apply 1 application topically as needed.    [provider]  hydrocortisone (ANUSOL-HC) 2.5 % rectal cream Place rectally as needed.    [provider]    Current Outpatient Medications  Medication Sig Dispense Refill   allopurinol (ZYLOPRIM) 300 MG tablet Take 300 mg by mouth daily.     Coenzyme Q10 (COQ10) 100 MG CAPS      Cyanocobalamin (B-12) 1000 MCG TABS      fluticasone (FLONASE) 50 MCG/ACT nasal spray Place 2 sprays into the nose daily.     Multiple Vitamins-Minerals (MULTIVITAMIN WITH MINERALS) tablet Take 1 tablet by mouth daily.     rosuvastatin (CRESTOR) 20 MG tablet Take 20 mg by mouth daily.     desoximetasone (TOPICORT) 0.25 % cream Apply 1 application topically as needed.     hydrocortisone (ANUSOL-HC) 2.5 % rectal cream Place rectally as needed.     Current Facility-Administered Medications  Medication Dose Route Frequency Provider Last Rate Last Admin   0.9 %  sodium chloride infusion  500 mL Intravenous Once Lucio Edward T, MD       0.9 %  sodium chloride infusion  500 mL Intravenous Once Ladene Artist, MD        Allergies as of 05/19/2022   (No Known Allergies)    Family History  Problem Relation Age of Onset   Alzheimer's disease Mother    Heart disease Father    Colon cancer Neg Hx    Colon polyps Neg Hx    Esophageal cancer Neg Hx    Rectal cancer Neg Hx    Stomach cancer  Neg Hx     Social History   Socioeconomic History   Marital status: Married    Spouse name: Not on file   Number of children: Not on file   Years of education: Not on file   Highest education level: Not on file  Occupational History   Not on file  Tobacco Use   Smoking status: Former    Types: Cigarettes    Quit date: 48    Years since quitting: 37.2   Smokeless tobacco: Never  Vaping Use   Vaping Use: Never used  Substance and Sexual Activity   Alcohol use: Yes    Alcohol/week: 24.0 standard drinks of alcohol    Types: 24 Cans of beer per week   Drug use: No   Sexual activity: Not on file  Other Topics Concern   Not on file  Social History Narrative   Not on file   Social Determinants of Health   Financial Resource Strain: Not on file  Food Insecurity: Not on file  Transportation Needs: Not on file  Physical Activity: Not on file  Stress: Not on file  Social Connections: Not on file  Intimate Partner Violence: Not on file    Review of Systems:  All systems reviewed were negative except where noted in HPI.   Physical Exam: General:  Alert, well-developed, in NAD Head:  Normocephalic and atraumatic. Eyes:  Sclera clear, no icterus.   Conjunctiva pink. Ears:  Normal auditory acuity. Mouth:  No deformity or lesions.  Neck:  Supple; no masses. Lungs:  Clear throughout to auscultation.   No wheezes, crackles, or rhonchi.  Heart:  Regular rate and rhythm; no murmurs. Abdomen:  Soft, nondistended, nontender. No masses, hepatomegaly. No palpable masses.  Normal bowel sounds.    Rectal:  Deferred   Msk:  Symmetrical without gross deformities. Extremities:  Without edema. Neurologic:  Alert and  oriented x 4; grossly normal neurologically. Skin:  Intact without significant lesions or rashes. Psych:  Alert and cooperative. Normal mood and affect.   [name]. Fuller Plan  05/19/2022, 7:59 AM See Shea Evans, [facility], to contact our on call provider

## 2022-05-19 NOTE — Progress Notes (Signed)
Report to PACU, RN, vss, BBS= Clear.  

## 2022-05-20 ENCOUNTER — Telehealth: Payer: Self-pay | Admitting: *Deleted

## 2022-05-20 NOTE — Telephone Encounter (Signed)
  Follow up Call-     05/19/2022    7:34 AM  Call back number  Post procedure Call Back phone  # [phone]  Permission to leave phone message Yes     Patient questions:  Message left to call us if necessary.

## 2023-05-17 DIAGNOSIS — K080 Exfoliation of teeth due to systemic causes: Secondary | ICD-10-CM | POA: Diagnosis not present

## 2023-05-18 DIAGNOSIS — H31003 Unspecified chorioretinal scars, bilateral: Secondary | ICD-10-CM | POA: Diagnosis not present

## 2023-05-18 DIAGNOSIS — H02834 Dermatochalasis of left upper eyelid: Secondary | ICD-10-CM | POA: Diagnosis not present

## 2023-05-18 DIAGNOSIS — H02831 Dermatochalasis of right upper eyelid: Secondary | ICD-10-CM | POA: Diagnosis not present

## 2023-05-24 ENCOUNTER — Encounter: Payer: Self-pay | Admitting: Emergency Medicine

## 2023-05-24 ENCOUNTER — Ambulatory Visit
Admission: EM | Admit: 2023-05-24 | Discharge: 2023-05-24 | Disposition: A | Discharge status: Home / Self Care | Attending: Family Medicine | Admitting: Family Medicine

## 2023-05-24 DIAGNOSIS — S61419A Laceration without foreign body of unspecified hand, initial encounter: Secondary | ICD-10-CM

## 2023-05-24 DIAGNOSIS — Z23 Encounter for immunization: Secondary | ICD-10-CM

## 2023-05-24 MED ORDER — TETANUS-DIPHTH-ACELL PERTUSSIS 5-2.5-18.5 LF-MCG/0.5 IM SUSY
0.5000 mL | PREFILLED_SYRINGE | Freq: Once | INTRAMUSCULAR | Status: AC
  Administered 2023-05-24: 0.5 mL via INTRAMUSCULAR

## 2023-05-24 NOTE — Discharge Instructions (Addendum)
 Contact [facility] tomorrow to schedule follow-up evaluation of the laceration of your hand given you are having some limitation of movement with your third digit (middle) as a result of your injury.  Office will either remove your sutures or you can return back here in 10 days to have your sutures removed.  Keep dressing changed at least twice daily or when soiled. Wash with warm soap and water at least twice daily.

## 2023-05-24 NOTE — ED Triage Notes (Signed)
 Pt reports R hand laceration that occurred ago while working on [employer] unit. Fan sliced his hand and has been holding pressure to site. Small amount of bleeding still at site. Approximately 1in laceration just below knuckles. Since incident middle finger does not want to straighten out, having to hold it upright. Not on blood thinners.

## 2023-05-24 NOTE — ED Provider Notes (Signed)
 [name]    CSN: [id number] Arrival date & time: 05/24/23  1551      History   Chief Complaint Chief Complaint  Patient presents with   Laceration    HPI Greg Thomas is a 73 y.o. male.  Patient here for evaluation of a laceration involving the right palmar surface of hand.  Injury occurred approximately 45 minutes prior to arrival when he was working on an [employer] [employer] unit in the sand subsequently cut the back of his hand.  Patient denies any loss of sensation however is having some decreased range of motion with movement of his middle finger.  There is no visible tendon injury on evaluation although patient is actively bleeding.  Is uncertain if he is up-to-date on his tetanus.   Past Medical History:  Diagnosis Date   Allergy    dust mites   Anal itch    Hyperlipidemia    Sinusitis, chronic     Patient Active Problem List   Diagnosis Date Noted   LLQ pain 11/05/2020   Melena 11/05/2020    Past Surgical History:  Procedure Laterality Date   CARPAL TUNNEL RELEASE  2006   right   ETHMOIDECTOMY Bilateral 12/15/2016   Procedure: ETHMOIDECTOMY WITH FRONTAL SINUS EXPLORATION;  Surgeon: Vernie Murders, MD;  Location: [facility];  Service: ENT;  Laterality: Bilateral;   IMAGE GUIDED SINUS SURGERY Bilateral 12/15/2016   Procedure: IMAGE GUIDED SINUS SURGERY  need disk;  Surgeon: Vernie Murders, MD;  [facility];  Service: ENT;  Laterality: Bilateral;  GAVE DISK TO [facility] 10-29 KP   KNEE ARTHROSCOPY  2004   right   MAXILLARY ANTROSTOMY Bilateral 12/15/2016   Procedure: MAXILLARY ANTROSTOMY WITH REMOVAL OF TISSUE FROM SINUS;  Surgeon: Vernie Murders, MD;  Location: [facility];  Service: ENT;  Laterality: Bilateral;   SEPTOPLASTY N/A 12/15/2016   Procedure: SEPTOPLASTY;  Surgeon: Vernie Murders, MD;  Location: [facility];  Service: ENT;  Laterality: N/A;   SPHENOIDECTOMY Bilateral 12/15/2016   Procedure: Selina Cooley;  Surgeon:  Vernie Murders, MD;  Location: [facility];  Service: ENT;  Laterality: Bilateral;   TURBINATE REDUCTION Bilateral 12/15/2016   Procedure: INFERIOR TURBINATE REDUCTION;  Surgeon: Vernie Murders, MD;  Location: [facility];  Service: ENT;  Laterality: Bilateral;       Home Medications    Prior to Admission medications   Medication Sig Start Date End Date Taking? Authorizing Provider  allopurinol (ZYLOPRIM) 300 MG tablet Take 300 mg by mouth daily.   Yes [provider]  Coenzyme Q10 (COQ10) 100 MG CAPS  02/15/20  Yes [provider]  Cyanocobalamin (B-12) 1000 MCG TABS  04/02/22  Yes [provider]  desoximetasone (TOPICORT) 0.25 % cream Apply 1 application topically as needed.   Yes [provider]  fluticasone (FLONASE) 50 MCG/ACT nasal spray Place 2 sprays into the nose daily.   Yes [provider]  hydrocortisone (ANUSOL-HC) 2.5 % rectal cream Place rectally as needed.   Yes [provider]  Multiple Vitamins-Minerals (MULTIVITAMIN WITH MINERALS) tablet Take 1 tablet by mouth daily.   Yes [provider]  rosuvastatin (CRESTOR) 20 MG tablet Take 20 mg by mouth daily.   Yes [provider]    Family History Family History  Problem Relation Age of Onset   Alzheimer's disease Mother    Heart disease Father    Colon cancer Neg Hx    Colon polyps Neg Hx    Esophageal  cancer Neg Hx    Rectal cancer Neg Hx    Stomach cancer Neg Hx     Social History Social History   Tobacco Use   Smoking status: Former    Current packs/day: 0.00    Types: Cigarettes    Quit date: 1987    Years since quitting: 38.2    Passive exposure: Past   Smokeless tobacco: Never  Vaping Use   Vaping status: Never Used  Substance Use Topics   Alcohol use: Yes    Alcohol/week: 24.0 standard drinks of alcohol    Types: 24 Cans of beer per week   Drug use: No     Allergies   Patient has no known  allergies.   Review of Systems Review of Systems Pertinent negatives listed in HPI  Physical Exam Triage Vital Signs ED Triage Vitals  Encounter Vitals Group     BP 05/24/23 1616 (!) 146/93     Systolic BP Percentile --      Diastolic BP Percentile --      Pulse Rate 05/24/23 1616 90     Resp 05/24/23 1616 16     Temp 05/24/23 1616 98.3 F (36.8 C)     Temp Source 05/24/23 1616 Oral     SpO2 05/24/23 1616 96 %     Weight --      Height --      Head Circumference --      Peak Flow --      Pain Score 05/24/23 1617 0     Pain Loc --      Pain Education --      Exclude from Growth Chart --    No data found.  Updated Vital Signs BP (!) 146/93 (BP Location: Left Arm)   Pulse 90   Temp 98.3 F (36.8 C) (Oral)   Resp 16   SpO2 96%   Visual Acuity Right Eye Distance:   Left Eye Distance:   Bilateral Distance:    Right Eye Near:   Left Eye Near:    Bilateral Near:     Physical Exam Vitals reviewed.  Constitutional:      Appearance: Normal appearance.  HENT:     Head: Normocephalic and atraumatic.  Eyes:     Extraocular Movements: Extraocular movements intact.  Cardiovascular:     Rate and Rhythm: Normal rate and regular rhythm.  Pulmonary:     Effort: Pulmonary effort is normal.     Breath sounds: Normal breath sounds.  Musculoskeletal:       Arms:     Cervical back: Normal range of motion.  Skin:    General: Skin is warm.  Neurological:     General: No focal deficit present.     Mental Status: He is alert and oriented to person, place, and time.      UC Treatments / Results  Labs (all labs ordered are listed, but only abnormal results are displayed) Labs Reviewed - No data to display  EKG   Radiology No results found.  Procedures Laceration Repair  Date/Time: 05/24/2023 6:44 PM  Performed by: Bing Neighbors, NP Authorized by: Bing Neighbors, NP   Consent:    Consent obtained:  Verbal   Consent given by:  Patient   Risks,  benefits, and alternatives were discussed: yes     Risks discussed:  Infection and pain Universal protocol:    Procedure explained and questions answered to patient or proxy's satisfaction: yes  Patient identity confirmed:  Verbally with patient Anesthesia:    Anesthesia method:  Local infiltration   Local anesthetic:  Lidocaine 1% w/o epi Laceration details:    Location:  Hand   Hand location:  R hand, dorsum   Length (cm):  4   Depth (mm):  2 Pre-procedure details:    Preparation:  Patient was prepped and draped in usual sterile fashion Exploration:    Limited defect created (wound extended): no     Wound exploration: wound explored through full range of motion     Contaminated: no   Treatment:    Area cleansed with:  Povidone-iodine   Amount of cleaning:  Standard   Irrigation solution:  Tap water   Irrigation method:  Pressure wash   Visualized foreign bodies/material removed: no     Layers/structures repaired:  Deep subcutaneous Deep subcutaneous:    Wound subcutaneous closure material used: Prolene. Skin repair:    Repair method:  Sutures   Suture size:  3-0   Suture material:  Prolene   Suture technique:  Simple interrupted   Number of sutures:  5 Approximation:    Approximation:  Loose Repair type:    Repair type:  Intermediate Post-procedure details:    Dressing:  Bulky dressing   Procedure completion:  Tolerated  (including critical care time)  Medications Ordered in UC Medications  Tdap (BOOSTRIX) injection 0.5 mL (has no administration in time range)    Initial Impression / Assessment and Plan / UC Course  I have reviewed the triage vital signs and the nursing notes.  Pertinent labs & imaging results that were available during my care of the patient were reviewed by me and considered in my medical decision making (see chart for details).    Deep laceration involving the dorsum of the right hand, suture repair completed successfully.  Patient has  some decreased flexion in the third digit on exam although he is neurovascularly intact. Visible tendon injury on evaluation however patient is advised to contact [facility], Dr. Orlan Leavens who is on-call for today to schedule follow-up in his office to ensure there is no tendon injury. Wound care instructions discussed with patient.  Nurse updated.  Patient verbalized understanding and agreement with plan today.  Final Clinical Impressions(s) / UC Diagnoses   Final diagnoses:  Laceration of dorsum of hand  Need for Tdap vaccination     Discharge Instructions      Contact [facility] tomorrow to schedule follow-up evaluation of the laceration of your hand given you are having some limitation of movement with your third digit (middle) as a result of your injury.  Office will either remove your sutures or you can return back here in 10 days to have your sutures removed.  Keep dressing changed at least twice daily or when soiled. Wash with warm soap and water at least twice daily.     ED Prescriptions   None    PDMP not reviewed this encounter.   Bing Neighbors, NP 05/24/23 [phone]

## 2023-05-29 DIAGNOSIS — S61411A Laceration without foreign body of right hand, initial encounter: Secondary | ICD-10-CM | POA: Diagnosis not present

## 2023-05-29 DIAGNOSIS — M79641 Pain in right hand: Secondary | ICD-10-CM | POA: Diagnosis not present

## 2023-06-02 DIAGNOSIS — Y999 Unspecified external cause status: Secondary | ICD-10-CM | POA: Diagnosis not present

## 2023-06-02 DIAGNOSIS — S66322A Laceration of extensor muscle, fascia and tendon of right middle finger at wrist and hand level, initial encounter: Secondary | ICD-10-CM | POA: Diagnosis not present

## 2023-06-02 DIAGNOSIS — S61212A Laceration without foreign body of right middle finger without damage to nail, initial encounter: Secondary | ICD-10-CM | POA: Diagnosis not present

## 2023-06-02 DIAGNOSIS — X58XXXA Exposure to other specified factors, initial encounter: Secondary | ICD-10-CM | POA: Diagnosis not present

## 2023-06-09 DIAGNOSIS — M79641 Pain in right hand: Secondary | ICD-10-CM | POA: Diagnosis not present

## 2023-06-15 DIAGNOSIS — M109 Gout, unspecified: Secondary | ICD-10-CM | POA: Diagnosis not present

## 2023-06-15 DIAGNOSIS — Z13820 Encounter for screening for osteoporosis: Secondary | ICD-10-CM | POA: Diagnosis not present

## 2023-06-15 DIAGNOSIS — M79641 Pain in right hand: Secondary | ICD-10-CM | POA: Diagnosis not present

## 2023-06-15 DIAGNOSIS — R7301 Impaired fasting glucose: Secondary | ICD-10-CM | POA: Diagnosis not present

## 2023-06-15 DIAGNOSIS — E785 Hyperlipidemia, unspecified: Secondary | ICD-10-CM | POA: Diagnosis not present

## 2023-06-15 DIAGNOSIS — E291 Testicular hypofunction: Secondary | ICD-10-CM | POA: Diagnosis not present

## 2023-06-15 DIAGNOSIS — R946 Abnormal results of thyroid function studies: Secondary | ICD-10-CM | POA: Diagnosis not present

## 2023-06-15 DIAGNOSIS — Z125 Encounter for screening for malignant neoplasm of prostate: Secondary | ICD-10-CM | POA: Diagnosis not present

## 2023-06-22 DIAGNOSIS — R5383 Other fatigue: Secondary | ICD-10-CM | POA: Diagnosis not present

## 2023-06-22 DIAGNOSIS — Z1331 Encounter for screening for depression: Secondary | ICD-10-CM | POA: Diagnosis not present

## 2023-06-22 DIAGNOSIS — R82998 Other abnormal findings in urine: Secondary | ICD-10-CM | POA: Diagnosis not present

## 2023-06-22 DIAGNOSIS — E785 Hyperlipidemia, unspecified: Secondary | ICD-10-CM | POA: Diagnosis not present

## 2023-06-22 DIAGNOSIS — I2510 Atherosclerotic heart disease of native coronary artery without angina pectoris: Secondary | ICD-10-CM | POA: Diagnosis not present

## 2023-06-22 DIAGNOSIS — M6281 Muscle weakness (generalized): Secondary | ICD-10-CM | POA: Diagnosis not present

## 2023-06-22 DIAGNOSIS — Z0000 Encounter for general adult medical examination without abnormal findings: Secondary | ICD-10-CM | POA: Diagnosis not present

## 2023-06-22 DIAGNOSIS — R531 Weakness: Secondary | ICD-10-CM | POA: Diagnosis not present

## 2023-06-22 DIAGNOSIS — Z1339 Encounter for screening examination for other mental health and behavioral disorders: Secondary | ICD-10-CM | POA: Diagnosis not present

## 2023-06-23 DIAGNOSIS — M79641 Pain in right hand: Secondary | ICD-10-CM | POA: Diagnosis not present

## 2023-06-30 DIAGNOSIS — M79641 Pain in right hand: Secondary | ICD-10-CM | POA: Diagnosis not present

## 2023-07-13 DIAGNOSIS — M79641 Pain in right hand: Secondary | ICD-10-CM | POA: Diagnosis not present

## 2023-07-27 DIAGNOSIS — M79641 Pain in right hand: Secondary | ICD-10-CM | POA: Diagnosis not present

## 2023-08-24 DIAGNOSIS — R030 Elevated blood-pressure reading, without diagnosis of hypertension: Secondary | ICD-10-CM | POA: Diagnosis not present

## 2023-09-01 DIAGNOSIS — R030 Elevated blood-pressure reading, without diagnosis of hypertension: Secondary | ICD-10-CM | POA: Diagnosis not present

## 2023-11-20 DIAGNOSIS — K080 Exfoliation of teeth due to systemic causes: Secondary | ICD-10-CM | POA: Diagnosis not present

## 2023-12-27 DIAGNOSIS — R946 Abnormal results of thyroid function studies: Secondary | ICD-10-CM | POA: Diagnosis not present

## 2024-01-18 DIAGNOSIS — G8929 Other chronic pain: Secondary | ICD-10-CM | POA: Diagnosis not present
